# Patient Record
Sex: Male | Born: 1985 | Hispanic: Yes | Smoking: Never smoker
Health system: Southern US, Community
[De-identification: ages and names within clinical notes are randomized; demographics above are authoritative.]

## PROBLEM LIST (undated history)

## (undated) DIAGNOSIS — E7800 Pure hypercholesterolemia, unspecified: Secondary | ICD-10-CM

## (undated) HISTORY — PX: LEG SURGERY: SHX1003

---

## 2015-08-19 ENCOUNTER — Encounter (HOSPITAL_COMMUNITY): Payer: Self-pay | Admitting: Emergency Medicine

## 2015-08-19 DIAGNOSIS — L03031 Cellulitis of right toe: Secondary | ICD-10-CM | POA: Insufficient documentation

## 2015-08-19 NOTE — ED Notes (Signed)
Pt c/o pain to right great toe x's 1 week.  C/O pain and swelling around nail of toe

## 2015-08-20 ENCOUNTER — Emergency Department (HOSPITAL_COMMUNITY): Payer: Self-pay

## 2015-08-20 ENCOUNTER — Emergency Department (HOSPITAL_COMMUNITY)
Admission: EM | Admit: 2015-08-20 | Discharge: 2015-08-20 | Disposition: A | Discharge status: Home / Self Care | Payer: Self-pay | Attending: Emergency Medicine | Admitting: Emergency Medicine

## 2015-08-20 DIAGNOSIS — L03031 Cellulitis of right toe: Secondary | ICD-10-CM

## 2015-08-20 MED ORDER — LIDOCAINE HCL (PF) 1 % IJ SOLN
5.0000 mL | Freq: Once | INTRAMUSCULAR | Status: AC
  Administered 2015-08-20: 5 mL

## 2015-08-20 MED ORDER — HYDROCODONE-ACETAMINOPHEN 5-325 MG PO TABS: 1.0000 | ORAL_TABLET | Freq: Four times a day (QID) | ORAL | Status: DC | PRN

## 2015-08-20 MED ORDER — SULFAMETHOXAZOLE-TRIMETHOPRIM 800-160 MG PO TABS: 1.0000 | ORAL_TABLET | Freq: Two times a day (BID) | ORAL | Status: AC

## 2015-08-20 MED ORDER — TETANUS-DIPHTH-ACELL PERTUSSIS 5-2.5-18.5 LF-MCG/0.5 IM SUSP
0.5000 mL | Freq: Once | INTRAMUSCULAR | Status: AC
  Administered 2015-08-20: 0.5 mL via INTRAMUSCULAR
  Filled 2015-08-20: qty 0.5

## 2015-08-20 MED ORDER — AMOXICILLIN-POT CLAVULANATE 875-125 MG PO TABS: 1.0000 | ORAL_TABLET | Freq: Once | ORAL | Status: DC

## 2015-08-20 MED ORDER — SULFAMETHOXAZOLE-TRIMETHOPRIM 800-160 MG PO TABS
1.0000 | ORAL_TABLET | Freq: Once | ORAL | Status: AC
  Administered 2015-08-20: 1 via ORAL
  Filled 2015-08-20: qty 1

## 2015-08-20 MED ORDER — BUPIVACAINE HCL 0.25 % IJ SOLN
5.0000 mL | Freq: Once | INTRAMUSCULAR | Status: DC
  Filled 2015-08-20: qty 5

## 2015-08-20 MED ORDER — NAPROXEN 500 MG PO TABS
500.0000 mg | ORAL_TABLET | Freq: Two times a day (BID) | ORAL | Status: DC
Start: 2015-08-20 — End: 2016-04-29

## 2015-08-20 MED ORDER — BUPIVACAINE HCL (PF) 0.25 % IJ SOLN
5.0000 mL | Freq: Once | INTRAMUSCULAR | Status: AC
  Administered 2015-08-20: 5 mL

## 2015-08-20 NOTE — Discharge Instructions (Signed)
Do not take the narcotic if driving as it will make you sleepy. °

## 2015-08-20 NOTE — ED Notes (Signed)
Pt soaking toe in warm water.

## 2015-08-20 NOTE — ED Provider Notes (Signed)
CSN: [id number]     Arrival date & time 08/19/15  2326 History   First MD Initiated Contact with Patient 08/20/15 0038     Chief Complaint  Patient presents with  . Toe Pain     (Consider location/radiation/quality/duration/timing/severity/associated sxs/prior Treatment) The history is provided by the patient. No language interpreter was used.   Drew Levine is a 30 y.o. with pain, swelling and redness to the right great toe that started one week ago after he pulled an ingrown toenail. Patient has taken nothing for pain. Patient has been picking at the area and trying to pull out the ingrown nail.   History reviewed. No pertinent past medical history. History reviewed. No pertinent past surgical history. No family history on file. Social History  Substance Use Topics  . Smoking status: Never Smoker   . Smokeless tobacco: None  . Alcohol Use: No    Review of Systems Negative except as stated in HPI   Allergies  Review of patient's allergies indicates not on file.  Home Medications   Prior to Admission medications   Medication Sig Start Date End Date Taking? Authorizing Provider  HYDROcodone-acetaminophen (NORCO) 5-325 MG tablet Take 1 tablet by mouth every 6 (six) hours as needed. 08/20/15   Zianna Dercole Orlene OchM Caidence Kaseman, NP  naproxen (NAPROSYN) 500 MG tablet Take 1 tablet (500 mg total) by mouth 2 (two) times daily. 08/20/15   Atha Mcbain Orlene OchM Arlenne Kimbley, NP  sulfamethoxazole-trimethoprim (BACTRIM DS,SEPTRA DS) 800-160 MG tablet Take 1 tablet by mouth 2 (two) times daily. 08/20/15 08/27/15  Raad Clayson Orlene OchM Dameion Briles, NP   BP 129/85 mmHg  Pulse 82  Temp(Src) 97.3 F (36.3 C) (Oral)  Resp 16  Ht 5\' 3"  (1.6 m)  Wt 77.111 kg  BMI 30.12 kg/m2  SpO2 95% Physical Exam  Constitutional: He is oriented to person, place, and time. He appears well-developed and well-nourished. No distress.  HENT:  Head: Normocephalic.  Eyes: EOM are normal.  Neck: Neck supple.  Cardiovascular: Normal rate.   Pulmonary/Chest: Effort normal.    Musculoskeletal:       Right foot: There is tenderness and swelling. There is normal capillary refill.       Feet:  Swelling and erythema surrounding the nail of the right great toe. Small wound noted where patient pulled at a hangnail that is now infected.   Neurological: He is alert and oriented to person, place, and time. No cranial nerve deficit.  Skin: Skin is warm and dry.  Psychiatric: He has a normal mood and affect. His behavior is normal.  Nursing note and vitals reviewed.   ED Course  .Marland Kitchen.Incision and Drainage Date/Time: 08/20/2015 3:21 AM Performed by: Janne NapoleonNEESE, Jasreet Dickie M Authorized by: Janne NapoleonNEESE, Raizel Wesolowski M Consent: Verbal consent obtained. Risks and benefits: risks, benefits and alternatives were discussed Consent given by: patient Patient understanding: patient states understanding of the procedure being performed Imaging studies: imaging studies available Required items: required blood products, implants, devices, and special equipment available Patient identity confirmed: verbally with patient Indications for incision and drainage: paronychia. Body area: lower extremity Location details: right big toe Anesthesia: digital block Local anesthetic: lidocaine 1% without epinephrine and bupivacaine 0.25% without epinephrine Anesthetic total: 5 ml Patient sedated: no Needle gauge: 22 Complexity: complex Drainage: purulent Drainage amount: moderate Wound treatment: wound left open Patient tolerance: Patient tolerated the procedure well with no immediate complications Comments: Using hemostat to hold the nail and scissors I cut 1/8 inch of the nail that was ingrown and removed it.    (  including critical care time) X-rays, digital block I&D  Labs Review Labs Reviewed - No data to display Dg Toe Great Right  08/20/2015  CLINICAL DATA:  Great toe nail bed infection for 1 week. EXAM: RIGHT GREAT TOE COMPARISON:  None. FINDINGS: No bony destructive change, periosteal reaction, or  abnormal density to suggest osteomyelitis. The alignment and joint spaces are maintained. Questionable air about the nailbed. No tracking soft tissue air. No radiopaque foreign body. IMPRESSION: No radiographic findings of osteomyelitis of the great toe. Electronically Signed   By: Rubye OaksMelanie  Ehinger M.D.   On: 08/20/2015 01:44     MDM  30 y.o. male with infected ingrown toenail of the right great toe stable for d/c without red streaking, fever and does not appear toxic. Will treat with antibiotics and patient will soak in warm water and antibacterial soap. He will return for worsening symptoms.   Final diagnoses:  Paronychia of toe of right foot       Janne NapoleonHope M Ranya Fiddler, NP 08/20/15 0327  Marily MemosJason Mesner, MD 08/21/15 1730

## 2016-04-29 ENCOUNTER — Ambulatory Visit (INDEPENDENT_AMBULATORY_CARE_PROVIDER_SITE_OTHER): Payer: Self-pay | Admitting: Family Medicine

## 2016-04-29 VITALS — BP 122/72 | HR 65 | Temp 97.3°F | Resp 17 | Ht 64.5 in | Wt 171.0 lb

## 2016-04-29 DIAGNOSIS — L600 Ingrowing nail: Secondary | ICD-10-CM

## 2016-04-29 MED ORDER — DOXYCYCLINE HYCLATE 100 MG PO TABS: 100.0000 mg | ORAL_TABLET | Freq: Two times a day (BID) | ORAL | 0 refills | Status: AC

## 2016-04-29 NOTE — Progress Notes (Signed)
   Drew GuadalajaraSaul Levine is a 31 y.o. male who presents to [facility] Care at [facility] today for:  1. Ingorwn toenail. Patient presents with ingrown toenail on the right great toe. States that he has had ingrown toenails here before. Went to the ED over a year ago to have toenail removed. States that they removed part of his toenail but since it has grown back and start causing pain. Pain is starting to affect his gait. Has been having issues with ingrown toenails for years. States that when it starts getting painful he soaks his toe and salt and sometimes puts lemon on it. If it is continue to drain ever since last year when it was removed. Intense has associated pus. Denies any excessive erythema around toe. Denies any fevers.   ROS as above.  Pertinently, no chest pain, palpitations, SOB, Fever, Chills, Abd pain, N/V/D.   PMH reviewed. Patient is a nonsmoker.   No past medical history on file. No past surgical history on file.  Medications reviewed. No current outpatient prescriptions on file.   No current facility-administered medications for this visit.     Physical Exam:  BP 122/72   Pulse 65   Temp 97.3 F (36.3 C) (Oral)   Resp 17   Ht 5' 4.5" (1.638 m)   Wt 171 lb (77.6 kg)   SpO2 97%   BMI 28.90 kg/m  Gen:  Alert, cooperative patient who appears stated age in no acute distress.  Vital signs reviewed. HEENT: EOMI,  MMM Pulm: effort normal Cardiac:  Regular rate  Exts: Non edematous BL LE, warm and well perfused. Right greater toenail with swelling and medial nailbed with skin breakdown and drainage. No surrounding erythema or warmth. Elevated area of hardened skin appreciated on medial aspect of toe.  Assessment and Plan:  1. Ingrown right greater toenail May be showing beginning stages of infection. Rx for doxycycline given. Complete toenail removed (see procedure note below). Aftercare instructions given.  Procedure note Ingrown nail, possible mild infection.  Patient gave  verbal consent. Appropriate time out taken. Used tourniquet at base of the toe.  Digital block done using 2% lidocaine without epinephrine, 8cc total.  Area prepped and draped in usual sterile fashion. Complete nail elevated using nail elevator, removed using hemostats and traction force. Patient had extra skin that was blocking regular nail growth. Excess skin was taken off with 11 blade. Nail bed was cleaned with copious saline wash. Xeroform was then placed over nailbed. No complications; patient was difficult to numb.  Minimal bleeding.  Sterile bandage applied and post procedure instructions given.  Patient tolerated procedure well without complications.   Drew AdaJazma Jalen Daluz, DO 04/29/2016, 3:56 PM PGY-3, [facility]

## 2016-04-29 NOTE — Patient Instructions (Addendum)
   UA DEL DEDO E[place]ARNA[place] (Ingrown Toenail) . Mantenga el rea limpia, seca y vendada por 24 horas. Marland Kitchen. Despus de 24 horas, quite el vendaje externo y deje el [place] [employer] officergaza en lugar. . Empape el dedo del pie/el pie en el agua jabonosa tibia por 5-10 minutos, una vez diario por 5 das. Reaplique un vendaje nuevo despus de cada limpieza. . Contine empapa hasta que se cae la gaza amarilla. . Notifique la oficina si usted tiene el siguiente de los muestras de la infeccin: Hinchazn, enrojecimiento, calor, drenaje del pus, de la fiebre > 101.0  INGROWN TOENAIL . Keep area clean, dry and bandaged for 24 hours. . After 24 hours, remove outer bandage and leave yellow gauze in place. [place]. Soak toe/foot in warm soapy water for 5-10 minutes, once daily for 5 days. Rebandage toe after each cleaning. . Continue soaks until yellow gauze falls off. . Notify the office if you experience any of the following signs of infection: Swelling, redness, pus drainage, streaking, fever > 101.0 F    IF you received an x-ray today, you will receive an invoice from [facility]. Please contact [facility] at [phone] with questions or concerns regarding your invoice.   IF you received labwork today, you will receive an invoice from [facility]. Please contact [facility] at [phone] with questions or concerns regarding your invoice.   Our billing staff will not be able to assist you with questions regarding bills from these companies.  You will be contacted with the lab results as soon as they are available. The fastest way to get your results is to activate your My Chart account. Instructions are located on the last page of this paperwork. If you have not heard from us regarding the results in 2 weeks, please contact this office.

## 2017-08-25 ENCOUNTER — Ambulatory Visit (INDEPENDENT_AMBULATORY_CARE_PROVIDER_SITE_OTHER): Payer: Self-pay | Admitting: Physician Assistant

## 2017-09-08 ENCOUNTER — Ambulatory Visit (INDEPENDENT_AMBULATORY_CARE_PROVIDER_SITE_OTHER): Payer: Self-pay | Admitting: Physician Assistant

## 2017-09-28 ENCOUNTER — Emergency Department (HOSPITAL_COMMUNITY)
Admission: EM | Admit: 2017-09-28 | Discharge: 2017-09-29 | Disposition: A | Discharge status: Home / Self Care | Payer: Self-pay | Attending: Emergency Medicine | Admitting: Emergency Medicine

## 2017-09-28 ENCOUNTER — Encounter (HOSPITAL_COMMUNITY): Payer: Self-pay | Admitting: Emergency Medicine

## 2017-09-28 ENCOUNTER — Other Ambulatory Visit: Payer: Self-pay

## 2017-09-28 DIAGNOSIS — L600 Ingrowing nail: Secondary | ICD-10-CM

## 2017-09-28 NOTE — ED Triage Notes (Signed)
Pt c/o ingrown toenail on right great toe x's 1 week.  Pt c/o pain in toe and pain radiates up right leg

## 2017-09-29 MED ORDER — CEPHALEXIN 500 MG PO CAPS: 500.0000 mg | ORAL_CAPSULE | Freq: Four times a day (QID) | ORAL | 0 refills | Status: AC

## 2017-09-29 NOTE — ED Provider Notes (Signed)
[facility] Provider Note   CSN: [id number] Arrival date & time: 09/28/17  2215     History   Chief Complaint Chief Complaint  Patient presents with  . Foot Pain    HPI [employer] translation services used for this visit. Drew Levine is a 32 y.o. male feeling for 1 week of right great toe pain.  Patient states that he has similar pain every time that his right toenail becomes ingrown and he has to go to his doctor to have it removed.  Patient states that he has developed worsening pain in his right great toe over the past 3 days that now radiates up his right foot.  Patient also states that he has had some pus draining from the corner of his right toenail.  Patient denies weakness, numbness or tingling or color change to the toe.  Patient denies injury to the area.  Patient denies history of chronic medical conditions including diabetes. HPI  History reviewed. No pertinent past medical history.  There are no active problems to display for this patient.   Past Surgical History:  Procedure Laterality Date  . LEG SURGERY          Home Medications    Prior to Admission medications   Medication Sig Start Date End Date Taking? Authorizing Provider  cephALEXin (KEFLEX) 500 MG capsule Take 1 capsule (500 mg total) by mouth 4 (four) times daily for 10 days. 09/29/17 10/09/17  Bill SalinasMorelli, Brandon A, PA-C    Family History No family history on file.  Social History Social History   Tobacco Use  . Smoking status: Never Smoker  . Smokeless tobacco: Never Used  Substance Use Topics  . Alcohol use: No  . Drug use: No     Allergies   Patient has no allergy information on record.   Review of Systems Review of Systems  Constitutional: Negative.  Negative for chills, fatigue and fever.  Musculoskeletal: Negative for arthralgias and joint swelling.  Skin: Positive for wound. Negative for color change and rash.     Physical  Exam Updated Vital Signs BP 127/87   Pulse 74   Temp 98.3 F (36.8 C) (Oral)   Resp 18   Ht 5\' 5"  (1.651 m)   SpO2 97%   BMI 28.46 kg/m   Physical Exam  Constitutional: He is oriented to person, place, and time. He appears well-developed and well-nourished. No distress.  HENT:  Head: Normocephalic and atraumatic.  Right Ear: External ear normal.  Left Ear: External ear normal.  Nose: Nose normal.  Eyes: Pupils are equal, round, and reactive to light. EOM are normal.  Neck: Trachea normal and normal range of motion. No tracheal deviation present.  Cardiovascular:  Pulses:      Dorsalis pedis pulses are 3+ on the right side, and 3+ on the left side.       Posterior tibial pulses are 3+ on the right side, and 3+ on the left side.  Pulmonary/Chest: Effort normal. No respiratory distress.  Musculoskeletal: Normal range of motion.       Right knee: Normal.       Left knee: Normal.       Right ankle: Normal.       Left ankle: Normal.       Right lower leg: Normal. He exhibits no tenderness, no swelling, no edema and no deformity.       Left lower leg: Normal.       Right  foot: There is tenderness and swelling. There is normal range of motion, normal capillary refill and no deformity.       Left foot: Normal.  Feet:  Right Foot:  Protective Sensation: 3 sites tested. 3 sites sensed.  Left Foot:  Protective Sensation: 3 sites tested. 3 sites sensed.  Neurological: He is alert and oriented to person, place, and time. No sensory deficit.  Skin: Skin is warm and dry.  Psychiatric: He has a normal mood and affect. His behavior is normal.       ED Treatments / Results  Labs (all labs ordered are listed, but only abnormal results are displayed) Labs Reviewed - No data to display  EKG None  Radiology No results found.  Procedures Procedures (including critical care time)  Medications Ordered in ED Medications - No data to display   Initial Impression / Assessment and  Plan / ED Course  I have reviewed the triage vital signs and the nursing notes.  Pertinent labs & imaging results that were available during my care of the patient were reviewed by me and considered in my medical decision making (see chart for details).    Patient presenting with 1 week history of right great toe ingrown toenail.  Patient with 3 days of increased pain and swelling.  Patient with history of ingrown toenails.  Drainage present to the corner of the right toe.  There is no surrounding erythema or streaking.  Patient is neurovascularly intact to the toe, capillary refill and sensation intact.  Pedal pulses strong and equal bilaterally.  Patient has full range of motion of the toe.  Patient is ambulatory with some increased pain to the area.  I have given the patient a prescription for Keflex as well as a referral to podiatrist.  Translation services were used during this visit and patient states understanding of importance of taking antibiotic medication and following up with the podiatrist as soon as possible for further evaluation and treatment.  I have given the patient and out for foot care.  Patient is afebrile, not tachycardic, not hypotensive, no acute distress at this time.  At this time there does not appear to be any evidence of an acute emergency medical condition and the patient appears stable for discharge with appropriate outpatient follow up. Diagnosis was discussed with patient who verbalizes understanding of care plan and is agreeable to discharge. I have discussed return precautions with patient who verbalizes understanding of return precautions. Patient strongly encouraged to follow-up with their PCP. All questions answered.    Note: Portions of this report may have been transcribed using voice recognition software. Every effort was made to ensure accuracy; however, inadvertent computerized transcription errors may still be present.   Final Clinical Impressions(s) /  ED Diagnoses   Final diagnoses:  Ingrown nail of great toe of right foot    ED Discharge Orders         Ordered    cephALEXin (KEFLEX) 500 MG capsule  4 times daily     09/29/17 0035           Bill SalinasMorelli, Brandon A, PA-C 09/29/17 0054    Nira Connardama, Pedro Eduardo, MD 09/29/17 [id number]

## 2017-09-29 NOTE — Discharge Instructions (Signed)
Please call the podiatrist, Dr. Logan BoresEvans, to schedule an appointment for further evaluation and treatment of your toe pain. This is the foot doctor. Please take the antibiotic, Keflex, as prescribed for infection. Please return to the emergency department for any new or worsening symptoms. Please also follow-up with your primary care provider regarding your visit today.  SOLICITE ATENCIN MDICA SI: Los sntomas no mejoran con Scientist, research (medical)el tratamiento. SOLICITE ATENCIN MDICA DE INMEDIATO SI: Tiene lneas rojas que comienzan en el pie y continan en la pierna. Tiene fiebre. El enrojecimiento, la hinchazn o el dolor Van Horneaumentan. Observa lquido, sangre o pus que sale de la ua del pie.  RESOURCE GUIDE  Chronic Pain Problems: Contact [facility]  [phone] Patients need to be referred by their primary care doctor.  Insufficient Money for Medicine: Contact United Way:  call "211" or [facility] [phone].  No Primary Care Doctor: Call Health Connect  [phone] - can help you locate a primary care doctor that  accepts your insurance, provides certain services, etc. Physician Referral Service- [phone]  Agencies that provide inexpensive medical care: [facility]  [phone] [facility]  [phone] [facility]  [phone] [facility]  [phone] [facility]  [phone] [facility]  [phone]  Medicaid-accepting [facility] Providers: [facility]- [address], [address]  [phone], Mon-Fri 9am-7pm, Sat 9am-1pm [facility]- [address], [address] [place]  [phone] [facility]- [address], [address] [place]  [phone] [facility]- [address]  [phone] Renaye RakersVeita Bland- [address], [address], [phone]  Only accepts [place] Access [place] patients after they have their name  applied to their card  Self Pay  (no insurance) in [facility]: Sickle Cell Patients: Dr Willey BladeEric Dean, [facility]  [address], [phone] [facility]- [address]  [phone]       Redge Gainer-     [facility]- [address], [address]       -     [facility]- see information above (Speak to CitigroupPam H if you do not have insurance)       -  [facility]- [address], [phone]       -  [facility]- [address],  [phone]       -  [facility]- [address], [phone]       -  Dr Julio Sickssei-Bonsu-  [address], [address], [place], [phone]       -  [facility]- [address], [phone]       -  [facility] [place]- [address], [phone], also [address], [phone]       -    [facility]- [address], [phone], 1st & 3rd Saturday   every month, 10am-1pm  1) Find a Doctor and Pay Out of Pocket Although you won't have to find out who is covered by your insurance plan, it is a good idea to ask around and get recommendations. You will then need to call the office and see if the doctor you have chosen will accept you as a new patient and what types of options they offer for patients who are self-pay. Some doctors offer discounts or will set up payment  plans for their patients who do not have insurance, but you will need to ask so you aren't surprised when you get to your appointment.  2) Contact Your Local Health Department Not all health departments have doctors that can see patients for sick visits, but many do, so it is worth a call to see if yours does. If you don't know where your local health department is, you can check in your phone book. The CDC also has a tool to help you locate your state's health department, and many state websites also have listings of all of their local health departments.  3) Find a Walk-in Clinic If your illness is not likely to be very severe  or complicated, you may want to try a walk in clinic. These are popping up all over the country in pharmacies, drugstores, and shopping centers. They're usually staffed by nurse practitioners or physician assistants that have been trained to treat common illnesses and complaints. They're usually fairly quick and inexpensive. However, if you have serious medical issues or chronic medical problems, these are probably not your best option  STD Testing [facility], STD Clinic, [address] [place], phone [phone] or [phone].  Monday - Friday, call for an appointment. [facility] [employer], STD Clinic, [address], [place], phone [phone] or [phone].  Monday - Friday, call for an appointment.  Abuse/Neglect: [facility] Child Abuse Hotline (213)210-0537 [facility] Child Abuse Hotline 9142376008 (After Hours)  Emergency Shelter:  [facility] 762-086-8912  Maternity Homes: Room at the [place] [facility] [phone]) [phone] [facility] [phone]  MRSA Hotline #:   586-790-7449  [facility] Resources  [facility] [place]  United Way [facility] Dept. [address]                 [address]         [address] [place] [address]  Blondell Reveal Phone:  [phone]                                  Phone:  [phone]                   Phone:  [phone]  [facility], [phone] [facility]- [phone]       -     [facility] in [place], [address],                                  [phone], [facility] Child Abuse Hotline 980-268-0820 or 365-391-3897 (After Hours)   Behavioral Health Services  Substance Abuse  Resources: Alcohol and Drug Services  2245169893 [facility] [phone] [facility] [place] [phone] [address] [phone] Residential & Outpatient  Substance Abuse Program  [phone]  Psychological Services: [facility]  [phone] [facility]  [phone] [facility], [phone] [address], [place], ACCESS LINE: [phone] or [phone], [email or web address]/adult.htm  Dental Assistance  If unable to pay or uninsured, contact:  [facility] or [facility] to become qualified for the adult dental clinic.  Patients with Medicaid: [facility] [phone] [address], [phone] [address], [phone]  If unable to pay, or uninsured, contact [facility] [phone]) or [facility] [phone] in [place], [phone] in [facility]) to become qualified for the adult dental clinic   Other Low-Cost Community Dental Services: [facility]- [address], [place], [place], [phone], [phone], [phone], 2nd and 4th Thursday of the month at 6:30am.  10 clients each day by appointment, can sometimes see walk-in patients if someone does not show for an appointment. [facility]- [address], [place], [place], [phone], [phone] [facility]- [address], [place], [place], [address], [phone] [facility]- [phone] [address] [facility]- [phone] [phone]- [phone]

## 2017-09-29 NOTE — ED Notes (Signed)
See provider assessment 

## 2017-10-06 ENCOUNTER — Ambulatory Visit: Payer: Self-pay | Admitting: Physician Assistant

## 2017-10-06 ENCOUNTER — Other Ambulatory Visit: Payer: Self-pay

## 2017-10-06 ENCOUNTER — Encounter: Payer: Self-pay | Admitting: Physician Assistant

## 2017-10-06 VITALS — BP 120/78 | HR 82 | Temp 98.2°F | Resp 18 | Ht 65.0 in | Wt 168.8 lb

## 2017-10-06 DIAGNOSIS — L600 Ingrowing nail: Secondary | ICD-10-CM

## 2017-10-06 NOTE — Progress Notes (Signed)
   Merdis DelaySaul Herrera Morales  MRN: [id number] DOB: [birth year]  PCP: Patient, No Pcp Per  Subjective:  Pt is a 32 year old male who presents to clinic for ingrown toenail of his right big toe.  Patient speaks Spanish and is here today with a friend who is interpreting for him.  He was seen in the emergency department on 8/13 for this problem.  Toenail removal was not performed but he was given prescription of Keflex which she was supposed to take 4 times a day.  He admits to medication noncompliance due to dosing schedule Pain has not improved and he would like his nail removed today  Review of Systems  Musculoskeletal: Positive for arthralgias and gait problem.  Skin: Positive for wound.    There are no active problems to display for this patient.   Current Outpatient Medications on File Prior to Visit  Medication Sig Dispense Refill  . cephALEXin (KEFLEX) 500 MG capsule Take 1 capsule (500 mg total) by mouth 4 (four) times daily for 10 days. 40 capsule 0   No current facility-administered medications on file prior to visit.     No Known Allergies   Objective:  BP 120/78   Pulse 82   Temp 98.2 F (36.8 C) (Oral)   Resp 18   Ht 5\' 5"  (1.651 m)   Wt 168 lb 12.8 oz (76.6 kg)   SpO2 96%   BMI 28.09 kg/m   Physical Exam  Constitutional: He is oriented to person, place, and time. He appears well-developed and well-nourished.  Musculoskeletal:       Feet:  Neurological: He is alert and oriented to person, place, and time.  Skin: Skin is warm and dry.  Psychiatric: He has a normal mood and affect. His behavior is normal. Judgment and thought content normal.  Vitals reviewed.  Procedure: Verbal consent obtained. Skin was cleaned with Betadine. Digital block performed with 2% plain lidocaine on proximal right big toe.  Toenail was completely avulsed from nailbed. Xeroform placed on nailbed. Dressing placed and wound care discussed.  Assessment and Plan :  1. Ingrown toenail of  right foot - Patient presents for worsening ingrown toenail of his right big toe.  Excision of nail performed successfully.  Wound was dressed and wound care discussed with instructions printed out in Spanish for patient.  Return to clinic as needed.   Marco CollieWhitney Samary Shatz, PA-C  Primary Care at [facility] : [facility] 10/06/2017 5:08 PM  Please note: Portions of this report may have been transcribed using dragon voice recognition software. Every effort was made to ensure accuracy; however, inadvertent computerized transcription errors may be present.

## 2017-10-06 NOTE — Patient Instructions (Addendum)
  UA DEL DEDO ENCARNADA (Ingrown Toenail) . Mantenga el rea limpia, seca y vendada por 24 horas. Marland Kitchen Despus de 24 horas, quite el vendaje externo y deje el amarillo Control and instrumentation engineer. . Empape el dedo del pie/el pie en el agua jabonosa tibia por 5-10 minutos, una vez diario por 5 das. Reaplique un vendaje nuevo despus de cada limpieza. . Contine empapa hasta que se cae la gaza amarilla. . Notifique la oficina si usted tiene el siguiente de los muestras de la infeccin: Hinchazn, enrojecimiento, calor, drenaje del pus, de la fiebre > 101.0   Thank you for coming in today. I hope you feel we met your needs.  Feel free to call PCP if you have any questions or further requests.  Please consider signing up for MyChart if you do not already have it, as this is a great way to communicate with me.  Best,  Whitney McVey, PA-C     IF you received an x-ray today, you will receive an invoice from [facility]. Please contact [facility] at [phone] with questions or concerns regarding your invoice.   IF you received labwork today, you will receive an invoice from [facility]. Please contact [facility] at [phone] with questions or concerns regarding your invoice.   Our billing staff will not be able to assist you with questions regarding bills from these companies.  You will be contacted with the lab results as soon as they are available. The fastest way to get your results is to activate your My Chart account. Instructions are located on the last page of this paperwork. If you have not heard from [place] regarding the results in 2 weeks, please contact this office.

## 2017-10-13 ENCOUNTER — Ambulatory Visit (INDEPENDENT_AMBULATORY_CARE_PROVIDER_SITE_OTHER): Payer: Self-pay | Admitting: Physician Assistant

## 2017-11-22 ENCOUNTER — Emergency Department (HOSPITAL_COMMUNITY)
Admission: EM | Admit: 2017-11-22 | Discharge: 2017-11-22 | Disposition: A | Discharge status: Home / Self Care | Payer: Self-pay | Attending: Emergency Medicine | Admitting: Emergency Medicine

## 2017-11-22 ENCOUNTER — Encounter (HOSPITAL_COMMUNITY): Payer: Self-pay | Admitting: *Deleted

## 2017-11-22 ENCOUNTER — Other Ambulatory Visit: Payer: Self-pay

## 2017-11-22 ENCOUNTER — Emergency Department (HOSPITAL_COMMUNITY): Payer: Self-pay

## 2017-11-22 DIAGNOSIS — Y999 Unspecified external cause status: Secondary | ICD-10-CM | POA: Insufficient documentation

## 2017-11-22 DIAGNOSIS — Y929 Unspecified place or not applicable: Secondary | ICD-10-CM | POA: Insufficient documentation

## 2017-11-22 DIAGNOSIS — Y939 Activity, unspecified: Secondary | ICD-10-CM | POA: Insufficient documentation

## 2017-11-22 DIAGNOSIS — S62346A Nondisplaced fracture of base of fifth metacarpal bone, right hand, initial encounter for closed fracture: Secondary | ICD-10-CM | POA: Insufficient documentation

## 2017-11-22 DIAGNOSIS — W228XXA Striking against or struck by other objects, initial encounter: Secondary | ICD-10-CM | POA: Insufficient documentation

## 2017-11-22 NOTE — ED Notes (Signed)
Patient transported to X-ray 

## 2017-11-22 NOTE — ED Triage Notes (Signed)
Pt in with injury to his right hand after hitting something, swelling noted

## 2017-11-22 NOTE — ED Notes (Signed)
Ortho tech paged  

## 2017-11-22 NOTE — Discharge Instructions (Signed)
Please keep the splint on and dry.    Return if any problems.  Dr. Carollee Massed office will call you tomorrow to arrange your next appointment with him in roughly a week to be casted.

## 2017-11-22 NOTE — ED Provider Notes (Signed)
[facility] EMERGENCY DEPARTMENT Provider Note   CSN: [id number] Arrival date & time: 11/22/17  1423     History   Chief Complaint Chief Complaint  Patient presents with  . Hand Injury    HPI Drew Levine is a 32 y.o. male.  He is right-hand dominant.  He is complaining of right hand pain for 3 days after being struck with wood to the hand.  It is painful with movement and improved with rest.  The pain does radiate up the forearm a little bit.  No numbness.  He is tried nothing for it.  He works in [employer] and was unable to work.  No other injuries or complaints.  The history is provided by the patient.  Hand Injury   Incident onset: 3 days. The incident occurred in the yard. The injury mechanism was a direct blow. The pain is present in the right hand. The quality of the pain is described as throbbing. The pain is moderate. The pain has been constant since the incident. Pertinent negatives include no fever. The symptoms are aggravated by movement, use and palpation.    History reviewed. No pertinent past medical history.  There are no active problems to display for this patient.   Past Surgical History:  Procedure Laterality Date  . LEG SURGERY          Home Medications    Prior to Admission medications   Not on File    Family History History reviewed. No pertinent family history.  Social History Social History   Tobacco Use  . Smoking status: Never Smoker  . Smokeless tobacco: Never Used  Substance Use Topics  . Alcohol use: No  . Drug use: No     Allergies   Patient has no known allergies.   Review of Systems Review of Systems  Constitutional: Negative for fever.  Respiratory: Negative for shortness of breath.   Cardiovascular: Negative for chest pain.  Skin: Negative for rash.     Physical Exam Updated Vital Signs BP (!) 108/53 (BP Location: Left Arm)   Pulse 61   Temp 98.3 F (36.8 C) (Oral)   Resp 16    SpO2 99%   Physical Exam  Constitutional: He appears well-developed and well-nourished.  HENT:  Head: Normocephalic and atraumatic.  Eyes: Conjunctivae are normal.  Neck: Neck supple.  Pulmonary/Chest: Effort normal.  Musculoskeletal:  His right hand is markedly swollen over the dorsum with diffuse tenderness mostly over the fourth and fifth metacarpal.  He has full range of motion of his digits but it is slightly limited with the swelling.  Cap refill and sensation intact.  No particular wrist pain or limitations.  Elbow and shoulder nontender.  Neurological: He is alert. GCS eye subscore is 4. GCS verbal subscore is 5. GCS motor subscore is 6.  Skin: Skin is warm and dry.  Psychiatric: He has a normal mood and affect.  Nursing note and vitals reviewed.    ED Treatments / Results  Labs (all labs ordered are listed, but only abnormal results are displayed) Labs Reviewed - No data to display  EKG None  Radiology Dg Hand Complete Right  Result Date: 11/22/2017 CLINICAL DATA:  Hit by tree 3 days ago with pain and swelling EXAM: RIGHT HAND - COMPLETE 3+ VIEW COMPARISON:  None. FINDINGS: There is a minimally displaced fracture through the base of the right fifth metacarpal present with adjacent soft tissue swelling. The radiocarpal joint space is normal.  The carpal bones are in normal position. No other fracture is seen. MCP, PIP, DIP joints appear normal. IMPRESSION: Minimally displaced fracture through the base of the right fifth metacarpal. Electronically Signed   By: Dwyane Dee M.D.   On: 11/22/2017 15:45    Procedures Procedures (including critical care time)  Medications Ordered in ED Medications - No data to display   Initial Impression / Assessment and Plan / ED Course  I have reviewed the triage vital signs and the nursing notes.  Pertinent labs & imaging results that were available during my care of the patient were reviewed by me and considered in my medical decision  making (see chart for details).  Clinical Course as of Nov 24 1439  Mon Nov 22, 2017  1553 By x-ray patient has a fracture of the base of his fifth metacarpal.  I reviewed this with Mr. Lin Givens from orthopedics who recommends ulnar gutter and will have follow-up with Dr. Janee Morn as an outpatient.   [MB]    Clinical Course User Index [MB] Terrilee Files, MD      Final Clinical Impressions(s) / ED Diagnoses   Final diagnoses:  Closed nondisplaced fracture of base of fifth metacarpal bone of right hand, initial encounter    ED Discharge Orders    None       Terrilee Files, MD 11/23/17 1441

## 2017-11-22 NOTE — Consult Note (Addendum)
ORTHOPAEDIC CONSULTATION HISTORY & PHYSICAL REQUESTING PHYSICIAN: Terrilee Files, MD  Chief Complaint: Right hand injury  HPI: Drew Levine is a 32 y.o. male, RHD, employed in food preparation who presents for evaluation of her right hand injury that occurred when he struck in a movable object 3 days ago.  He has had bruising pain and swelling, centered on the dorsal and ulnar aspect of the hand, prompting evaluation.  History reviewed. No pertinent past medical history. Past Surgical History:  Procedure Laterality Date  . LEG SURGERY     Social History   Socioeconomic History  . Marital status: Significant Other    Spouse name: Not on file  . Number of children: Not on file  . Years of education: Not on file  . Highest education level: Not on file  Occupational History  . Not on file  Social Needs  . Financial resource strain: Not on file  . Food insecurity:    Worry: Not on file    Inability: Not on file  . Transportation needs:    Medical: Not on file    Non-medical: Not on file  Tobacco Use  . Smoking status: Never Smoker  . Smokeless tobacco: Never Used  Substance and Sexual Activity  . Alcohol use: No  . Drug use: No  . Sexual activity: Never  Lifestyle  . Physical activity:    Days per week: Not on file    Minutes per session: Not on file  . Stress: Not on file  Relationships  . Social connections:    Talks on phone: Not on file    Gets together: Not on file    Attends religious service: Not on file    Active member of club or organization: Not on file    Attends meetings of clubs or organizations: Not on file    Relationship status: Not on file  Other Topics Concern  . Not on file  Social History Narrative  . Not on file   History reviewed. No pertinent family history. No Known Allergies Prior to Admission medications   Not on File   Dg Hand Complete Right  Result Date: 11/22/2017 CLINICAL DATA:  Hit by tree 3 days ago with pain and  swelling EXAM: RIGHT HAND - COMPLETE 3+ VIEW COMPARISON:  None. FINDINGS: There is a minimally displaced fracture through the base of the right fifth metacarpal present with adjacent soft tissue swelling. The radiocarpal joint space is normal. The carpal bones are in normal position. No other fracture is seen. MCP, PIP, DIP joints appear normal. IMPRESSION: Minimally displaced fracture through the base of the right fifth metacarpal. Electronically Signed   By: Dwyane Dee M.D.   On: 11/22/2017 15:45    Positive ROS: All other systems have been reviewed and were otherwise negative with the exception of those mentioned in the HPI and as above.  Physical Exam: Vitals: Refer to EMR. Constitutional:  WD, WN, NAD HEENT:  NCAT, EOMI Neuro/Psych:  Alert & oriented to person, place, and time; appropriate mood & affect Lymphatic: No generalized extremity edema or lymphadenopathy Extremities / MSK:  The extremities are normal with respect to appearance, ranges of motion, joint stability, muscle strength/tone, sensation, & perfusion except as otherwise noted:  The right hand is bruised and tender, particularly along the proximal portions of the fifth metacarpal.  No significant angulation palpable, no digital malrotation, digital motion only minimally restricted, MVI  Assessment: Relatively nondisplaced right fifth metacarpal base fracture, that appears to  be without significant articular incongruity  Plan: I discussed these findings with him.  We will plan to proceed with closed management.  Ulnar gutter splint to be applied today, likely transitioning to a short arm cast when he follows up in the office in a week. He reported his number to be [phone].  Questions invited and answered.  Cliffton Asters Janee Morn, MD      Orthopaedic & Hand Surgery [facility] Orthopaedic & Sports Medicine [facility] [address] [place], [place]  [address] Office: [phone] Mobile: [phone]  11/22/2017, 4:54 PM

## 2017-11-22 NOTE — ED Notes (Signed)
Dr. Thompson at bedside. 

## 2017-11-22 NOTE — Progress Notes (Signed)
Orthopedic Tech Progress Note Patient Details:  Drew Levine May 01, 1985 [id number]  Ortho Devices Type of Ortho Device: Ace wrap, Ulna gutter splint Ortho Device/Splint Location: rue Ortho Device/Splint Interventions: Application   Post Interventions Patient Tolerated: Well Instructions Provided: Care of device   Nikki Dom 11/22/2017, 5:10 PM

## 2017-12-08 ENCOUNTER — Ambulatory Visit (INDEPENDENT_AMBULATORY_CARE_PROVIDER_SITE_OTHER): Payer: Self-pay | Admitting: Physician Assistant

## 2017-12-08 ENCOUNTER — Encounter (INDEPENDENT_AMBULATORY_CARE_PROVIDER_SITE_OTHER): Payer: Self-pay | Admitting: Physician Assistant

## 2017-12-08 VITALS — BP 119/67 | HR 79 | Temp 98.5°F | Resp 18 | Ht 64.0 in | Wt 167.0 lb

## 2017-12-08 DIAGNOSIS — F524 Premature ejaculation: Secondary | ICD-10-CM

## 2017-12-08 DIAGNOSIS — Z23 Encounter for immunization: Secondary | ICD-10-CM

## 2017-12-08 DIAGNOSIS — R1013 Epigastric pain: Secondary | ICD-10-CM

## 2017-12-08 DIAGNOSIS — R12 Heartburn: Secondary | ICD-10-CM

## 2017-12-08 DIAGNOSIS — Z114 Encounter for screening for human immunodeficiency virus [HIV]: Secondary | ICD-10-CM

## 2017-12-08 MED ORDER — PAROXETINE HCL 10 MG PO TABS: 10.0000 mg | ORAL_TABLET | Freq: Every day | ORAL | 2 refills | Status: DC

## 2017-12-08 MED ORDER — OMEPRAZOLE 40 MG PO CPDR: 40.0000 mg | DELAYED_RELEASE_CAPSULE | Freq: Two times a day (BID) | ORAL | 0 refills | Status: DC

## 2017-12-08 NOTE — Patient Instructions (Signed)
Heartburn Heartburn is a type of pain or discomfort that can happen in the throat or chest. It is often described as a burning pain. It may also cause a bad taste in the mouth. Heartburn may feel worse when you lie down or bend over. It may be caused by stomach contents that move back up (reflux) into the tube that connects the mouth with the stomach (esophagus). Follow these instructions at home: Take these actions to lessen your discomfort and to help avoid problems. Diet  Follow a diet as told by your doctor. You may need to avoid foods and drinks such as: ? Coffee and tea (with or without caffeine). ? Drinks that contain alcohol. ? Energy drinks and sports drinks. ? Carbonated drinks or sodas. ? Chocolate and cocoa. ? Peppermint and mint flavorings. ? Garlic and onions. ? Horseradish. ? Spicy and acidic foods, such as peppers, chili powder, curry powder, vinegar, hot sauces, and BBQ sauce. ? Citrus fruit juices and citrus fruits, such as oranges, lemons, and limes. ? Tomato-based foods, such as red sauce, chili, salsa, and pizza with red sauce. ? Fried and fatty foods, such as donuts, french fries, potato chips, and high-fat dressings. ? High-fat meats, such as hot dogs, rib eye steak, sausage, ham, and bacon. ? High-fat dairy items, such as whole milk, butter, and cream cheese.  Eat small meals often. Avoid eating large meals.  Avoid drinking large amounts of liquid with your meals.  Avoid eating meals during the 2-3 hours before bedtime.  Avoid lying down right after you eat.  Do not exercise right after you eat. General instructions  Pay attention to any changes in your symptoms.  Take over-the-counter and prescription medicines only as told by your doctor. Do not take aspirin, ibuprofen, or other NSAIDs unless your doctor says it is okay.  Do not use any tobacco products, including cigarettes, chewing tobacco, and e-cigarettes. If you need help quitting, ask your  doctor.  Wear loose clothes. Do not wear anything tight around your waist.  Raise (elevate) the head of your bed about 6 inches (15 cm).  Try to lower your stress. If you need help doing this, ask your doctor.  If you are overweight, lose an amount of weight that is healthy for you. Ask your doctor about a safe weight loss goal.  Keep all follow-up visits as told by your doctor. This is important. Contact a doctor if:  You have new symptoms.  You lose weight and you do not know why it is happening.  You have trouble swallowing, or it hurts to swallow.  You have wheezing or a cough that keeps happening.  Your symptoms do not get better with treatment.  You have heartburn often for more than two weeks. Get help right away if:  You have pain in your arms, neck, jaw, teeth, or back.  You feel sweaty, dizzy, or light-headed.  You have chest pain or shortness of breath.  You throw up (vomit) and your throw up looks like blood or coffee grounds.  Your poop (stool) is bloody or black. This information is not intended to replace advice given to you by your health care provider. Make sure you discuss any questions you have with your health care provider. Document Released: 10/15/2010 Document Revised: 07/11/2015 Document Reviewed: 05/30/2014 Elsevier Interactive Patient Education  2018 Elsevier Inc.  

## 2017-12-08 NOTE — Progress Notes (Signed)
Subjective:  Patient ID: Drew Levine, male    DOB: 09/09/1985  Age: 32 y.o. MRN: [id number]  CC:   HPI Drew Levine is a 32 y.o. male with a medical history of closed nondisplaced fracture of base of fifth metacarpal bone of right hand presents with waxing and waning left sided chest pain since one year ago. Last episode of chest pain was three weeks ago. Felt chest pain with associated tremors and vomiting. Said he felt immediately better once he vomited. Has never been worked up by a physician. Says he suffers from "terrible" acid reflux. Chest pain worse when drinking coffee and Red Bull or when eating tomato sauce. No other associated symptoms.     Also complains of premature ejaculation. Would ejaculate after a "couple of minutes". Would like to know if any treatment is available as his premature ejaculation is causing strain in the relationship.      ROS Review of Systems  Constitutional: Negative for chills, fever and malaise/fatigue.  Eyes: Negative for blurred vision.  Respiratory: Negative for shortness of breath.   Cardiovascular: Negative for chest pain and palpitations.  Gastrointestinal: Positive for abdominal pain and vomiting. Negative for blood in stool, constipation, diarrhea, melena and nausea.  Genitourinary: Negative for dysuria and hematuria.  Musculoskeletal: Negative for joint pain and myalgias.  Skin: Negative for rash.  Neurological: Negative for tingling and headaches.  Psychiatric/Behavioral: Negative for depression. The patient is not nervous/anxious.     Objective:  BP 119/67 (BP Location: Left Arm, Patient Position: Sitting, Cuff Size: Normal)   Pulse 79   Temp 98.5 F (36.9 C) (Oral)   Resp 18   Ht 5\' 4"  (1.626 m)   Wt 167 lb (75.8 kg)   SpO2 95%   BMI 28.67 kg/m   BP/Weight 12/08/2017 11/22/2017 10/06/2017  Systolic BP 119 136 120  Diastolic BP 67 74 78  Wt. (Lbs) 167 - 168.8  BMI 28.67 - 28.09      Physical Exam   Constitutional: He is oriented to person, place, and time.  Well developed, well nourished, NAD, polite  HENT:  Head: Normocephalic and atraumatic.  Eyes: No scleral icterus.  Neck: Normal range of motion. Neck supple. No thyromegaly present.  Cardiovascular: Normal rate, regular rhythm and normal heart sounds.  Pulmonary/Chest: Effort normal and breath sounds normal.  Abdominal: Soft. Bowel sounds are normal. There is tenderness (mild epigastric TTP).  Musculoskeletal: He exhibits no edema.  Neurological: He is alert and oriented to person, place, and time.  Skin: Skin is warm and dry. No rash noted. No erythema. No pallor.  Psychiatric: He has a normal mood and affect. His behavior is normal. Thought content normal.  Vitals reviewed.    Assessment & Plan:   1. Heartburn - Begin omeprazole (PRILOSEC) 40 MG capsule; Take 1 capsule (40 mg total) by mouth 2 (two) times daily.  Dispense: 40 capsule; Refill: 0  2. Abdominal pain, epigastric - H. pylori antibody, IgG - Comprehensive metabolic panel - CBC with Differential - Lipase  3. Screening for HIV (human immunodeficiency virus) - HIV Antibody (routine testing w rflx)  4. Need for prophylactic vaccination and inoculation against influenza - Administered Fluarix in clinic today  5. Premature ejaculation - Begin PARoxetine (PAXIL) 10 MG tablet; Take 1 tablet (10 mg total) by mouth daily.  Dispense: 30 tablet; Refill: 2   Meds ordered this encounter  Medications  . omeprazole (PRILOSEC) 40 MG capsule    Sig: Take 1 capsule (  40 mg total) by mouth 2 (two) times daily.    Dispense:  40 capsule    Refill:  0    Order Specific Question:   Supervising Provider    Answer:   Hoy Register [[id number]]  . PARoxetine (PAXIL) 10 MG tablet    Sig: Take 1 tablet (10 mg total) by mouth daily.    Dispense:  30 tablet    Refill:  2    Order Specific Question:   Supervising Provider    Answer:   Hoy Register [[id number]]    Follow-up:  Return in about 4 weeks (around 01/05/2018) for Epigastric pain.   Loletta Specter PA

## 2017-12-10 ENCOUNTER — Other Ambulatory Visit (INDEPENDENT_AMBULATORY_CARE_PROVIDER_SITE_OTHER): Payer: Self-pay | Admitting: Physician Assistant

## 2017-12-10 DIAGNOSIS — A048 Other specified bacterial intestinal infections: Secondary | ICD-10-CM

## 2017-12-10 LAB — CBC WITH DIFFERENTIAL/PLATELET
Basophils Absolute: 0.1 10*3/uL (ref 0.0–0.2)
Basos: 1 %
EOS (ABSOLUTE): 0.3 10*3/uL (ref 0.0–0.4)
Eos: 4 %
Hematocrit: 47.9 % (ref 37.5–51.0)
Hemoglobin: 16 g/dL (ref 13.0–17.7)
Immature Grans (Abs): 0 10*3/uL (ref 0.0–0.1)
Immature Granulocytes: 0 %
Lymphocytes Absolute: 2.4 10*3/uL (ref 0.7–3.1)
Lymphs: 26 %
MCH: 29.2 pg (ref 26.6–33.0)
MCHC: 33.4 g/dL (ref 31.5–35.7)
MCV: 87 fL (ref 79–97)
Monocytes Absolute: 0.7 10*3/uL (ref 0.1–0.9)
Monocytes: 8 %
Neutrophils Absolute: 5.5 10*3/uL (ref 1.4–7.0)
Neutrophils: 61 %
Platelets: 342 10*3/uL (ref 150–450)
RBC: 5.48 x10E6/uL (ref 4.14–5.80)
RDW: 12.4 % (ref 12.3–15.4)
WBC: 9 10*3/uL (ref 3.4–10.8)

## 2017-12-10 LAB — COMPREHENSIVE METABOLIC PANEL
ALT: 23 IU/L (ref 0–44)
AST: 17 IU/L (ref 0–40)
Albumin/Globulin Ratio: 1.4 (ref 1.2–2.2)
Albumin: 4.3 g/dL (ref 3.5–5.5)
Alkaline Phosphatase: 79 IU/L (ref 39–117)
BUN/Creatinine Ratio: 22 — ABNORMAL HIGH (ref 9–20)
BUN: 13 mg/dL (ref 6–20)
Bilirubin Total: 0.3 mg/dL (ref 0.0–1.2)
CO2: 25 mmol/L (ref 20–29)
Calcium: 9.8 mg/dL (ref 8.7–10.2)
Chloride: 101 mmol/L (ref 96–106)
Creatinine, Ser: 0.59 mg/dL — ABNORMAL LOW (ref 0.76–1.27)
GFR calc Af Amer: 157 mL/min/{1.73_m2} (ref >59)
GFR calc non Af Amer: 136 mL/min/{1.73_m2} (ref >59)
Globulin, Total: 3 g/dL (ref 1.5–4.5)
Glucose: 88 mg/dL (ref 65–99)
Potassium: 4.2 mmol/L (ref 3.5–5.2)
Sodium: 142 mmol/L (ref 134–144)
Total Protein: 7.3 g/dL (ref 6.0–8.5)

## 2017-12-10 LAB — LIPASE: Lipase: 45 U/L (ref 13–78)

## 2017-12-10 LAB — HIV ANTIBODY (ROUTINE TESTING W REFLEX): HIV Screen 4th Generation wRfx: NONREACTIVE

## 2017-12-10 LAB — H. PYLORI ANTIBODY, IGG: H. pylori, IgG AbS: 4.86 Index Value — ABNORMAL HIGH (ref 0.00–0.79)

## 2017-12-10 MED ORDER — CLARITHROMYCIN 500 MG PO TABS: 500.0000 mg | ORAL_TABLET | Freq: Two times a day (BID) | ORAL | 0 refills | Status: AC

## 2017-12-10 MED ORDER — AMOXICILLIN 500 MG PO TABS: 1000.0000 mg | ORAL_TABLET | Freq: Two times a day (BID) | ORAL | 0 refills | Status: AC

## 2017-12-15 ENCOUNTER — Telehealth (INDEPENDENT_AMBULATORY_CARE_PROVIDER_SITE_OTHER): Payer: Self-pay

## 2017-12-15 NOTE — Telephone Encounter (Signed)
Called all numbers listed for patient using pacific interpreter [phone]) numbers either unavailable or states call can not be completed at this time. Will try once more before mailing results. If patient calls please inform that he has been exposed to stomach bacteria and antibiotics have been sent to [facility], take as directed. HIV negative and all other labs normal. Maryjean Morn, CMA

## 2017-12-15 NOTE — Telephone Encounter (Signed)
-----   Message from Loletta Specter, PA-C sent at 12/10/2017 12:53 PM EDT ----- Pt has been exposed stomach bacteria. I will send antibiotics now to [facility]. Take as directed. HIV negative and rest of labs normal.

## 2017-12-20 ENCOUNTER — Encounter (INDEPENDENT_AMBULATORY_CARE_PROVIDER_SITE_OTHER): Payer: Self-pay

## 2017-12-20 ENCOUNTER — Telehealth (INDEPENDENT_AMBULATORY_CARE_PROVIDER_SITE_OTHER): Payer: Self-pay

## 2017-12-20 NOTE — Telephone Encounter (Signed)
-----   Message from Loletta Specter, PA-C sent at 12/10/2017 12:53 PM EDT ----- Pt has been exposed stomach bacteria. I will send antibiotics now to [facility]. Take as directed. HIV negative and rest of labs normal.

## 2017-12-20 NOTE — Telephone Encounter (Signed)
Call placed using pacific interpreter [phone]) left message asking patient to return call to [facility] at [phone]. Results mailed to patient as this was the second unsuccessful attempt. Maryjean Morn, CMA

## 2018-01-05 ENCOUNTER — Encounter (INDEPENDENT_AMBULATORY_CARE_PROVIDER_SITE_OTHER): Payer: Self-pay | Admitting: Physician Assistant

## 2018-01-05 ENCOUNTER — Other Ambulatory Visit: Payer: Self-pay

## 2018-01-05 ENCOUNTER — Ambulatory Visit (INDEPENDENT_AMBULATORY_CARE_PROVIDER_SITE_OTHER): Payer: Self-pay | Admitting: Physician Assistant

## 2018-01-05 VITALS — BP 119/77 | HR 61 | Temp 97.7°F | Ht 64.0 in | Wt 171.6 lb

## 2018-01-05 DIAGNOSIS — F524 Premature ejaculation: Secondary | ICD-10-CM

## 2018-01-05 DIAGNOSIS — B9681 Helicobacter pylori [H. pylori] as the cause of diseases classified elsewhere: Secondary | ICD-10-CM

## 2018-01-05 DIAGNOSIS — R12 Heartburn: Secondary | ICD-10-CM

## 2018-01-05 DIAGNOSIS — A048 Other specified bacterial intestinal infections: Secondary | ICD-10-CM

## 2018-01-05 MED ORDER — CLARITHROMYCIN 500 MG PO TABS: 500.0000 mg | ORAL_TABLET | Freq: Two times a day (BID) | ORAL | 0 refills | Status: AC

## 2018-01-05 MED ORDER — AMOXICILLIN 500 MG PO TABS: 1000.0000 mg | ORAL_TABLET | Freq: Two times a day (BID) | ORAL | 0 refills | Status: AC

## 2018-01-05 MED ORDER — PAROXETINE HCL 10 MG PO TABS: 20.0000 mg | ORAL_TABLET | Freq: Every day | ORAL | 2 refills | Status: DC

## 2018-01-05 MED ORDER — OMEPRAZOLE 40 MG PO CPDR: 40.0000 mg | DELAYED_RELEASE_CAPSULE | Freq: Two times a day (BID) | ORAL | 0 refills | Status: DC

## 2018-01-05 MED ORDER — LIDOCAINE-PRILOCAINE 2.5-2.5 % EX KIT: PACK | Freq: Once | CUTANEOUS | 5 refills | Status: DC

## 2018-01-05 MED ORDER — LIDOCAINE-PRILOCAINE 2.5-2.5 % EX KIT: PACK | Freq: Once | CUTANEOUS | 5 refills | Status: AC

## 2018-01-05 NOTE — Progress Notes (Signed)
Subjective:  Patient ID: Drew Levine, male    DOB: 12/17/1985  Age: 32 y.o. MRN: [id number]  CC: f/u epigastric pain   HPI Drew Levine is a 32 y.o. male with a medical history of closed nondisplaced fracture of base of fifth metacarpal bone of right hand presents to f/u on epigastric pain, heartburn, and premature ejaculation. He was prescribed omeprazole for epigastric pain and heartburn. Feels 50% better when taking omeprazole. His work up revealed positive H pylori IgG. Says he was not called about results and [facility] did not call him to pick up his antibiotics. Does not endorse hematochezia or melena.     Reports very little to no prolongation of ejaculation with Paroxetine. Has been taking daily as directed. Does not report side effects. Does not endorse any other symptoms or complaints.       Outpatient Medications Prior to Visit  Medication Sig Dispense Refill  . omeprazole (PRILOSEC) 40 MG capsule Take 1 capsule (40 mg total) by mouth 2 (two) times daily. (Patient not taking: Reported on 01/05/2018) 40 capsule 0  . PARoxetine (PAXIL) 10 MG tablet Take 1 tablet (10 mg total) by mouth daily. (Patient not taking: Reported on 01/05/2018) 30 tablet 2   No facility-administered medications prior to visit.      ROS Review of Systems  Constitutional: Negative for chills, fever and malaise/fatigue.  Eyes: Negative for blurred vision.  Respiratory: Negative for shortness of breath.   Cardiovascular: Negative for chest pain and palpitations.  Gastrointestinal: Positive for heartburn. Negative for abdominal pain and nausea.  Genitourinary: Negative for dysuria and hematuria.       Premature ejaculation  Musculoskeletal: Negative for joint pain and myalgias.  Skin: Negative for rash.  Neurological: Negative for tingling and headaches.  Psychiatric/Behavioral: Negative for depression. The patient is not nervous/anxious.     Objective:  BP 119/77 (BP Location: Left  Arm, Patient Position: Sitting, Cuff Size: Normal)   Pulse 61   Temp 97.7 F (36.5 C) (Oral)   Ht 5\' 4"  (1.626 m)   Wt 171 lb 9.6 oz (77.8 kg)   SpO2 92%   BMI 29.46 kg/m   BP/Weight 01/05/2018 12/08/2017 11/22/2017  Systolic BP 119 119 136  Diastolic BP 77 67 74  Wt. (Lbs) 171.6 167 -  BMI 29.46 28.67 -      Physical Exam  Constitutional: He is oriented to person, place, and time.  Well developed, well nourished, NAD, polite  HENT:  Head: Normocephalic and atraumatic.  Eyes: No scleral icterus.  Neck: Normal range of motion. Neck supple. No thyromegaly present.  Cardiovascular: Normal rate, regular rhythm and normal heart sounds.  Pulmonary/Chest: Effort normal and breath sounds normal.  Abdominal: Soft. Bowel sounds are normal. There is no tenderness.  Musculoskeletal: He exhibits no edema.  Neurological: He is alert and oriented to person, place, and time.  Skin: Skin is warm and dry. No rash noted. No erythema. No pallor.  Psychiatric: He has a normal mood and affect. His behavior is normal. Thought content normal.  Vitals reviewed.    Assessment & Plan:    1. H. pylori infection - amoxicillin (AMOXIL) 500 MG tablet; Take 2 tablets (1,000 mg total) by mouth 2 (two) times daily for 14 days.  Dispense: 56 tablet; Refill: 0 - clarithromycin (BIAXIN) 500 MG tablet; Take 1 tablet (500 mg total) by mouth 2 (two) times daily for 14 days.  Dispense: 28 tablet; Refill: 0  2. Premature ejaculation - PARoxetine (  PAXIL) 10 MG tablet; Take 2 tablets (20 mg total) by mouth daily.  Dispense: 60 tablet; Refill: 2 - lidocaine-prilocaine (AGONEAZE) cream; Apply topically once for 1 dose.  Dispense: 1 each; Refill: 5  3. Heartburn - omeprazole (PRILOSEC) 40 MG capsule; Take 1 capsule (40 mg total) by mouth 2 (two) times daily.  Dispense: 40 capsule; Refill: 0   Meds ordered this encounter  Medications  . amoxicillin (AMOXIL) 500 MG tablet    Sig: Take 2 tablets (1,000 mg total)  by mouth 2 (two) times daily for 14 days.    Dispense:  56 tablet    Refill:  0    Order Specific Question:   Supervising Provider    Answer:   Hoy RegisterNEWLIN, ENOBONG [[id number]]  . clarithromycin (BIAXIN) 500 MG tablet    Sig: Take 1 tablet (500 mg total) by mouth 2 (two) times daily for 14 days.    Dispense:  28 tablet    Refill:  0    Order Specific Question:   Supervising Provider    Answer:   Hoy RegisterNEWLIN, ENOBONG [[id number]]  . DISCONTD: lidocaine-prilocaine (AGONEAZE) cream    Sig: Apply topically once for 1 dose.    Dispense:  1 each    Refill:  5    Order Specific Question:   Supervising Provider    Answer:   Hoy RegisterNEWLIN, ENOBONG [[id number]]  . DISCONTD: PARoxetine (PAXIL) 10 MG tablet    Sig: Take 2 tablets (20 mg total) by mouth daily.    Dispense:  60 tablet    Refill:  2    Order Specific Question:   Supervising Provider    Answer:   Hoy RegisterNEWLIN, ENOBONG [[id number]]  . omeprazole (PRILOSEC) 40 MG capsule    Sig: Take 1 capsule (40 mg total) by mouth 2 (two) times daily.    Dispense:  40 capsule    Refill:  0    Order Specific Question:   Supervising Provider    Answer:   Hoy RegisterNEWLIN, ENOBONG [[id number]]  . PARoxetine (PAXIL) 10 MG tablet    Sig: Take 2 tablets (20 mg total) by mouth daily.    Dispense:  60 tablet    Refill:  2    Order Specific Question:   Supervising Provider    Answer:   Hoy RegisterNEWLIN, ENOBONG [[id number]]  . lidocaine-prilocaine (AGONEAZE) cream    Sig: Apply topically once for 1 dose.    Dispense:  1 each    Refill:  5    Order Specific Question:   Supervising Provider    Answer:   Hoy RegisterNEWLIN, ENOBONG [[id number]]    Follow-up: Return in about 4 weeks (around 02/02/2018) for Premature ejaculation.   Loletta Specteroger David Gennifer Potenza PA

## 2018-01-05 NOTE — Patient Instructions (Signed)
Infeccin por Helicobacter Pylori (Helicobacter Pylori Infection) La infeccin por Helicobacter pylori es una infeccin en el estmago que es causada por la bacteria Helicobacter pylori (H. pylori). Este tipo de bacteria vive frecuentemente en el revestimiento del estmago. La infeccin puede causar lceras e irritacin (gastritis) en algunas personas. Es la causa ms comn de lceras en el estmago (lcera gstrica) y en la parte superior del intestino (lcera duodenal). Tener esta infeccin tambin puede aumentar el riesgo de cncer de estmago y un tipo de cncer de los glbulos blancos (linfoma) que afecta al estmago. CAUSAS H. pylori es un tipo de bacteria que se encuentra frecuentemente en el estmago de las personas saludables. La bacteria puede pasar de una persona a otra por contacto a travs de las heces o la saliva. No se sabe por qu algunas personas desarrollan lceras, gastritis o cncer a partir de la infeccin. FACTORES DE RIESGO Es ms probable que esta afeccin se manifieste en las personas que:  Tienen familiares con esta infeccin.  Viven con muchas otras personas; por ejemplo, en un dormitorio.  Son de origen africano, hispano o asitico. SNTOMAS La mayora de las personas con esta infeccin no tienen sntomas. Si tiene sntomas, estos pueden incluir los siguientes:  Acidez estomacal.  Dolor de estmago.  Nuseas.  Vmitos.  Sangre en el vmito.  Prdida del apetito.  Mal aliento. DIAGNSTICO Esta afeccin se puede diagnosticar en funcin de los sntomas, un examen fsico y diferentes pruebas. Podrn solicitarle otros estudios, por ejemplo:  Anlisis de sangre o pruebas de materia fecal para verificar las protenas (anticuerpos) que el cuerpo puede producir en respuesta a las bacterias. Estas pruebas son la mejor manera de confirmar el diagnstico.  Una prueba de aliento para verificar el tipo de gas que la bacteria H. pylori libera despus de descomponer una  sustancia llamada urea. Para la prueba, se le pide que beba urea. Esta prueba se realiza frecuentemente despus del tratamiento para saber si el tratamiento funcion.  Un procedimiento en el que un tubo delgado y flexible con una pequea cmara en el extremo se coloca en el estmago y el intestino superior (endoscopia superior). El mdico tambin puede tomar muestras de tejido (biopsia) para realizar pruebas de H. pylori y cncer. TRATAMIENTO El tratamiento para esta afeccin generalmente implica tomar una combinacin de medicamentos (terapia triple) durante un par de semanas. La terapia triple incluye un medicamento para reducir el cido en el estmago y dos tipos de antibiticos. Se aprobaron muchas combinaciones de frmacos para el tratamiento. El tratamiento generalmente mata a la H. pylori y reduce el riesgo de cncer. Despus del tratamiento, es posible que deba volver a realizarse una prueba de H. pylori. En algunos casos, es posible que sea necesario repetir el tratamiento. INSTRUCCIONES PARA EL CUIDADO EN EL HOGAR  Tome los medicamentos de venta libre y los recetados solamente como se lo haya indicado el mdico.  Tome los antibiticos como se lo haya indicado el mdico. No deje de tomar los antibiticos aunque comience a sentirse mejor.  Puede retomar todas sus actividades habituales y continuar su dieta habitual.  Tome estas medidas para evitar infecciones futuras: ? Lvese las manos con frecuencia. ? Asegrese de que los alimentos que consume se hayan preparado adecuadamente. ? Beba agua solamente de fuentes limpias.  Concurra a todas las visitas de control como se lo haya indicado el mdico. Esto es importante. SOLICITE ATENCIN MDICA SI:  Los sntomas no mejoran.  Los sntomas regresan despus del tratamiento. Esta   informacin no tiene como fin reemplazar el consejo del mdico. Asegrese de hacerle al mdico cualquier pregunta que tenga. Document Released: 05/27/2015 Document  Revised: 05/27/2015 Document Reviewed: 02/14/2014 Elsevier Interactive Patient Education  2018 Elsevier Inc.  

## 2018-02-02 ENCOUNTER — Other Ambulatory Visit: Payer: Self-pay

## 2018-02-02 ENCOUNTER — Encounter (INDEPENDENT_AMBULATORY_CARE_PROVIDER_SITE_OTHER): Payer: Self-pay | Admitting: Physician Assistant

## 2018-02-02 ENCOUNTER — Ambulatory Visit (INDEPENDENT_AMBULATORY_CARE_PROVIDER_SITE_OTHER): Payer: Self-pay | Admitting: Physician Assistant

## 2018-02-02 DIAGNOSIS — R12 Heartburn: Secondary | ICD-10-CM

## 2018-02-02 DIAGNOSIS — F524 Premature ejaculation: Secondary | ICD-10-CM

## 2018-02-02 MED ORDER — OMEPRAZOLE 40 MG PO CPDR: 40.0000 mg | DELAYED_RELEASE_CAPSULE | Freq: Every day | ORAL | 1 refills | Status: DC

## 2018-02-02 MED ORDER — PAROXETINE HCL 20 MG PO TABS: 20.0000 mg | ORAL_TABLET | Freq: Every day | ORAL | 5 refills | Status: DC

## 2018-02-02 NOTE — Progress Notes (Signed)
Subjective:  Patient ID: Drew Levine, male    DOB: 1985/07/29  Age: 32 y.o. MRN: [id number]  CC: f/u premature ejaculation  HPI Drew BudSaul Herrera Moralesis a 31 y.o.malewith a medical history of H pylori infection, premature ejaculation, and closed nondisplaced fracture of base of fifth metacarpal bone of right handpresents to f/u on premature ejaculation. Last seen here nearly once month ago. Reported little effect with paroxetine 10 mg. Had paroxetine increased to 20 mg and prescribed Agoneaze. Was never dispensed Agoneaze but is taking Paroxetine 20 mg with very good results. He is able to control his premature ejaculation and says the wife is satisfied with their sex life. Pt denies side effects or any other complaints/symptoms. Requests refills. Also requests refill for omeprazole "in case" he gets heartburn again.    Outpatient Medications Prior to Visit  Medication Sig Dispense Refill  . PARoxetine (PAXIL) 10 MG tablet Take 2 tablets (20 mg total) by mouth daily. 60 tablet 2  . omeprazole (PRILOSEC) 40 MG capsule Take 1 capsule (40 mg total) by mouth 2 (two) times daily. (Patient not taking: Reported on 02/02/2018) 40 capsule 0   No facility-administered medications prior to visit.      ROS Review of Systems  Constitutional: Negative for chills, fever and malaise/fatigue.  Eyes: Negative for blurred vision.  Respiratory: Negative for shortness of breath.   Cardiovascular: Negative for chest pain and palpitations.  Gastrointestinal: Negative for abdominal pain and nausea.  Genitourinary: Negative for dysuria and hematuria.  Musculoskeletal: Negative for joint pain and myalgias.  Skin: Negative for rash.  Neurological: Negative for tingling and headaches.  Psychiatric/Behavioral: Negative for depression. The patient is not nervous/anxious.     Objective:  BP 110/68 (BP Location: Left Arm, Patient Position: Sitting, Cuff Size: Normal)   Pulse 61   Temp 97.8 F (36.6  C) (Oral)   Ht 5\' 4"  (1.626 m)   Wt 173 lb 9.6 oz (78.7 kg)   SpO2 94%   BMI 29.80 kg/m   BP/Weight 02/02/2018 01/05/2018 12/08/2017  Systolic BP 110 119 119  Diastolic BP 68 77 67  Wt. (Lbs) 173.6 171.6 167  BMI 29.8 29.46 28.67      Physical Exam Vitals signs reviewed.  Constitutional:      Comments: Well developed, well nourished, NAD, polite  HENT:     Head: Normocephalic and atraumatic.  Eyes:     General: No scleral icterus. Neck:     Musculoskeletal: Normal range of motion and neck supple.     Thyroid: No thyromegaly.  Cardiovascular:     Rate and Rhythm: Normal rate and regular rhythm.     Heart sounds: Normal heart sounds.  Pulmonary:     Effort: Pulmonary effort is normal.     Breath sounds: Normal breath sounds.  Skin:    General: Skin is warm and dry.     Coloration: Skin is not pale.     Findings: No erythema or rash.  Neurological:     Mental Status: He is alert and oriented to person, place, and time.  Psychiatric:        Mood and Affect: Mood normal.        Behavior: Behavior normal.        Thought Content: Thought content normal.      Assessment & Plan:    1. Premature ejaculation - Refill PARoxetine (PAXIL) 20 MG tablet; Take 1 tablet (20 mg total) by mouth daily.  Dispense: 30 tablet; Refill: 5  2. Heartburn - Refill omeprazole (PRILOSEC) 40 MG capsule; Take 1 capsule (40 mg total) by mouth daily.  Dispense: 30 capsule; Refill: 1   Meds ordered this encounter  Medications  . omeprazole (PRILOSEC) 40 MG capsule    Sig: Take 1 capsule (40 mg total) by mouth daily.    Dispense:  30 capsule    Refill:  1    Order Specific Question:   Supervising Provider    Answer:   Hoy Register [[id number]]  . PARoxetine (PAXIL) 20 MG tablet    Sig: Take 1 tablet (20 mg total) by mouth daily.    Dispense:  30 tablet    Refill:  5    Order Specific Question:   Supervising Provider    Answer:   Hoy Register [[id number]]    Follow-up: Return in about 6  months (around 08/04/2018) for premature ejaculation.   Loletta Specter PA

## 2018-02-02 NOTE — Patient Instructions (Signed)
Acidez estomacal  Heartburn  La acidez estomacal es un tipo de dolor o malestar que puede sentirse en la garganta o en el pecho. Con frecuencia se describe como un dolor urente (ardor). Tambin puede causar mal sabor en la boca que se siente cido. La acidez estomacal puede sentirse con mayor intensidad cuando usted se acuesta o se inclina, y a menudo empeora durante la noche. La acidez estomacal puede deberse al contenido del estmago que sube por el esfago (reflujo).  Siga estas indicaciones en su casa:  Comida y bebida     Evite ciertos alimentos y bebidas como se lo haya indicado el mdico. Estos pueden incluir:  ? Caf y t (con o sin cafena).  ? Bebidas que contengan alcohol.  ? Bebidas energticas y deportivas.  ? Bebidas gaseosas o refrescos.  ? Chocolate y cacao.  ? Menta y esencia de menta.  ? Ajo y cebolla.  ? Rbano picante.  ? Alimentos condimentados, picantes y cidos, por ejemplo, todos los tipos de pimientas, chile en polvo, curry en polvo, vinagre, salsas picantes y salsa barbacoa.  ? Ctricos y sus jugos, por ejemplo, naranjas, limones y limas.  ? Alimentos a base de tomate, como salsa de tomate, chile, salsa picante y pizza con salsa de tomate.  ? Alimentos fritos y grasos, como donas, papas fritas y aderezos ricos en grasas.  ? Carnes con alto contenido de grasa, como salchichas, y cortes de carnes rojas y blancas con mucha grasa, por ejemplo, chuletas o costillas, embutidos, jamn y tocino.  ? Productos lcteos ricos en grasas, como leche entera, manteca y queso crema.   Haga comidas pequeas y frecuentes durante el da en lugar de comidas abundantes.   Evite beber grandes cantidades de lquidos con las comidas.   Evite comer 2 o 3horas antes de acostarse.   Evite recostarse inmediatamente despus de comer.   No haga ejercicios enseguida despus de comer.  Estilo de vida          Si tiene sobrepeso, baje hasta llegar a un peso saludable para usted. Pdale consejos al mdico para bajar  de peso de manera segura.   No consuma ningn producto que contenga nicotina o tabaco, como cigarrillos, cigarrillos electrnicos y tabaco de mascar. Estos pueden empeorar los sntomas. Si necesita ayuda para dejar de fumar, consulte al mdico.   Use ropa suelta. No use nada apretado alrededor de la cintura que haga presin sobre el abdomen.   Levante (eleve) la cabecera de la cama aproximadamente 6pulgadas (15cm) para dormir.   Trate de reducir el nivel de estrs con actividades tales como el yoga o la meditacin. Si necesita ayuda para reducir el nivel de estrs, consulte al mdico.  Indicaciones generales   Est atento a cualquier cambio en los sntomas.   Tome los medicamentos de venta libre y los recetados solamente como se lo haya indicado el mdico.  ? No tome aspirina, ibuprofeno ni otros antiinflamatorios no esteroideos (AINE) a menos que el mdico se lo indique.  ? Deje de tomar los medicamentos solamente como se lo haya indicado el mdico. Si deja de tomar algunos medicamentos demasiado rpido, los sntomas pueden empeorar.   Concurra a todas las visitas de seguimiento como se lo haya indicado el mdico. Esto es importante.  Comunquese con un mdico si:   Aparecen nuevos sntomas.   Baja de peso sin causa aparente.   Tiene dificultad para tragar, o le duele cuando traga.   Tiene tos persistente o sibilancias.     Los sntomas no mejoran con el tratamiento.   Tiene acidez estomacal con frecuencia durante ms de 2semanas.  Solicite ayuda inmediatamente si:   Siente dolor en los brazos, el cuello, la mandbula, los dientes o la espalda.   Se siente transpirado, mareado o tiene una sensacin de desvanecimiento.   Siente falta de aire o dolor en el pecho.   Vomita y el vmito tiene un aspecto similar a la sangre o a los posos de caf.   Las heces son sanguinolentas o negras.  Estos sntomas pueden representar un problema grave que constituye una emergencia. No espere a ver si los sntomas  desaparecen. Solicite atencin mdica de inmediato. Comunquese con el servicio de emergencias de su localidad (911 en los [place]). No conduzca por sus propios medios hasta el hospital.  Resumen   La acidez estomacal es un tipo de dolor o malestar que puede sentirse en la garganta o en el pecho. Con frecuencia se describe como un dolor urente (ardor). Tambin puede causar mal sabor en la boca que se siente cido.   Evite ciertos alimentos y bebidas como se lo haya indicado el mdico.   Tome los medicamentos de venta libre y los recetados solamente como se lo haya indicado el mdico. No tome aspirina, ibuprofeno ni otros antiinflamatorios no esteroideos (AINE) a menos que el mdico se lo indique.   Comunquese con un mdico si los sntomas no mejoran o si empeoran.  Esta informacin no tiene como fin reemplazar el consejo del mdico. Asegrese de hacerle al mdico cualquier pregunta que tenga.  Document Released: 10/15/2010 Document Revised: 08/11/2017 Document Reviewed: 08/11/2017  Elsevier Interactive Patient Education  2019 Elsevier Inc.

## 2018-03-13 ENCOUNTER — Emergency Department (HOSPITAL_COMMUNITY): Payer: Self-pay

## 2018-03-13 ENCOUNTER — Encounter (HOSPITAL_COMMUNITY): Payer: Self-pay | Admitting: Emergency Medicine

## 2018-03-13 ENCOUNTER — Emergency Department (HOSPITAL_COMMUNITY)
Admission: EM | Admit: 2018-03-13 | Discharge: 2018-03-13 | Disposition: A | Discharge status: Home / Self Care | Payer: Self-pay | Attending: Emergency Medicine | Admitting: Emergency Medicine

## 2018-03-13 DIAGNOSIS — J189 Pneumonia, unspecified organism: Secondary | ICD-10-CM

## 2018-03-13 DIAGNOSIS — R590 Localized enlarged lymph nodes: Secondary | ICD-10-CM

## 2018-03-13 DIAGNOSIS — D7389 Other diseases of spleen: Secondary | ICD-10-CM

## 2018-03-13 DIAGNOSIS — D739 Disease of spleen, unspecified: Secondary | ICD-10-CM | POA: Insufficient documentation

## 2018-03-13 DIAGNOSIS — Z79899 Other long term (current) drug therapy: Secondary | ICD-10-CM | POA: Insufficient documentation

## 2018-03-13 LAB — COMPREHENSIVE METABOLIC PANEL
ALK PHOS: 59 U/L (ref 38–126)
ALT: 27 U/L (ref 0–44)
AST: 23 U/L (ref 15–41)
Albumin: 4.1 g/dL (ref 3.5–5.0)
Anion gap: 10 (ref 5–15)
BUN: 14 mg/dL (ref 6–20)
CALCIUM: 9 mg/dL (ref 8.9–10.3)
CO2: 22 mmol/L (ref 22–32)
Chloride: 103 mmol/L (ref 98–111)
Creatinine, Ser: 0.59 mg/dL — ABNORMAL LOW (ref 0.61–1.24)
GFR calc Af Amer: >60 mL/min (ref >60)
GFR calc non Af Amer: >60 mL/min (ref >60)
Glucose, Bld: 117 mg/dL — ABNORMAL HIGH (ref 70–99)
Potassium: 3.8 mmol/L (ref 3.5–5.1)
Sodium: 135 mmol/L (ref 135–145)
Total Bilirubin: 0.9 mg/dL (ref 0.3–1.2)
Total Protein: 7.2 g/dL (ref 6.5–8.1)

## 2018-03-13 LAB — CBC WITH DIFFERENTIAL/PLATELET
Abs Immature Granulocytes: 0.03 10*3/uL (ref 0.00–0.07)
Basophils Absolute: 0 10*3/uL (ref 0.0–0.1)
Basophils Relative: 1 %
Eosinophils Absolute: 0.3 10*3/uL (ref 0.0–0.5)
Eosinophils Relative: 3 %
HCT: 50.7 % (ref 39.0–52.0)
HEMOGLOBIN: 17 g/dL (ref 13.0–17.0)
Immature Granulocytes: 0 %
Lymphocytes Relative: 24 %
Lymphs Abs: 2.1 10*3/uL (ref 0.7–4.0)
MCH: 29.7 pg (ref 26.0–34.0)
MCHC: 33.5 g/dL (ref 30.0–36.0)
MCV: 88.5 fL (ref 80.0–100.0)
MONOS PCT: 7 %
Monocytes Absolute: 0.6 10*3/uL (ref 0.1–1.0)
Neutro Abs: 5.5 10*3/uL (ref 1.7–7.7)
Neutrophils Relative %: 65 %
Platelets: 297 10*3/uL (ref 150–400)
RBC: 5.73 MIL/uL (ref 4.22–5.81)
RDW: 12.2 % (ref 11.5–15.5)
WBC: 8.6 10*3/uL (ref 4.0–10.5)
nRBC: 0 % (ref 0.0–0.2)

## 2018-03-13 LAB — LIPASE, BLOOD: LIPASE: 37 U/L (ref 11–51)

## 2018-03-13 MED ORDER — ONDANSETRON HCL 4 MG/2ML IJ SOLN
4.0000 mg | Freq: Once | INTRAMUSCULAR | Status: AC
  Administered 2018-03-13: 4 mg via INTRAVENOUS
  Filled 2018-03-13: qty 2

## 2018-03-13 MED ORDER — IOHEXOL 300 MG/ML  SOLN
100.0000 mL | Freq: Once | INTRAMUSCULAR | Status: AC | PRN
  Administered 2018-03-13: 100 mL via INTRAVENOUS

## 2018-03-13 MED ORDER — SODIUM CHLORIDE 0.9 % IV BOLUS
1000.0000 mL | Freq: Once | INTRAVENOUS | Status: AC
  Administered 2018-03-13: 1000 mL via INTRAVENOUS

## 2018-03-13 MED ORDER — MORPHINE SULFATE (PF) 4 MG/ML IV SOLN
4.0000 mg | Freq: Once | INTRAVENOUS | Status: AC
  Administered 2018-03-13: 4 mg via INTRAVENOUS
  Filled 2018-03-13: qty 1

## 2018-03-13 MED ORDER — AZITHROMYCIN 250 MG PO TABS
500.0000 mg | ORAL_TABLET | Freq: Once | ORAL | Status: AC
  Administered 2018-03-13: 500 mg via ORAL
  Filled 2018-03-13: qty 2

## 2018-03-13 MED ORDER — AZITHROMYCIN 250 MG PO TABS: 250.0000 mg | ORAL_TABLET | Freq: Every day | ORAL | 0 refills | Status: DC

## 2018-03-13 NOTE — ED Triage Notes (Signed)
Pt reports upper abdominal pain x4 days. Denies any other symptoms.

## 2018-03-13 NOTE — ED Provider Notes (Signed)
[facility] EMERGENCY DEPARTMENT Provider Note   CSN: [id number] Arrival date & time: 03/13/18  0820     History   Chief Complaint Chief Complaint  Patient presents with  . Abdominal Pain    HPI Drew Levine is a 33 y.o. male.  HPI Patient is a 33 year old male who presents the emergency department with 4 days of generalized upper abdominal pain with some nausea.  Denies vomiting.  No chills.   History reviewed. No pertinent past medical history.  There are no active problems to display for this patient.   Past Surgical History:  Procedure Laterality Date  . LEG SURGERY          Home Medications    Prior to Admission medications   Medication Sig Start Date End Date Taking? Authorizing Provider  azithromycin (ZITHROMAX) 250 MG tablet Take 1 tablet (250 mg total) by mouth daily. Take first 2 tablets together, then 1 every day until finished. 03/13/18   Azalia Bilis, MD  omeprazole (PRILOSEC) 40 MG capsule Take 1 capsule (40 mg total) by mouth daily. 02/02/18   Loletta Specter, PA-C  PARoxetine (PAXIL) 10 MG tablet Take 2 tablets (20 mg total) by mouth daily. 01/05/18   Loletta Specter, PA-C  PARoxetine (PAXIL) 20 MG tablet Take 1 tablet (20 mg total) by mouth daily. 02/02/18   Loletta Specter, PA-C    Family History No family history on file.  Social History Social History   Tobacco Use  . Smoking status: Never Smoker  . Smokeless tobacco: Never Used  Substance Use Topics  . Alcohol use: No  . Drug use: No     Allergies   Patient has no known allergies.   Review of Systems Review of Systems  All other systems reviewed and are negative.    Physical Exam Updated Vital Signs BP 121/90   Pulse 78   Temp 97.7 F (36.5 C) (Oral)   Resp 18   Ht 5\' 6"  (1.676 m)   Wt 68 kg   SpO2 96%   BMI 24.21 kg/m   Physical Exam Vitals signs and nursing note reviewed.  Constitutional:      Appearance: He is  well-developed.  HENT:     Head: Normocephalic and atraumatic.  Neck:     Musculoskeletal: Normal range of motion.  Cardiovascular:     Rate and Rhythm: Normal rate and regular rhythm.     Heart sounds: Normal heart sounds.  Pulmonary:     Effort: Pulmonary effort is normal. No respiratory distress.     Breath sounds: Normal breath sounds.  Abdominal:     General: There is no distension.     Palpations: Abdomen is soft.     Tenderness: There is no abdominal tenderness.  Musculoskeletal: Normal range of motion.  Skin:    General: Skin is warm and dry.  Neurological:     Mental Status: He is alert and oriented to person, place, and time.  Psychiatric:        Judgment: Judgment normal.      ED Treatments / Results  Labs (all labs ordered are listed, but only abnormal results are displayed) Labs Reviewed  COMPREHENSIVE METABOLIC PANEL - Abnormal; Notable for the following components:      Result Value   Glucose, Bld 117 (*)    Creatinine, Ser 0.59 (*)    All other components within normal limits  CBC WITH DIFFERENTIAL/PLATELET  LIPASE, BLOOD  URINALYSIS, ROUTINE W REFLEX  MICROSCOPIC    EKG None  Radiology Ct Chest Wo Contrast  Result Date: 03/13/2018 CLINICAL DATA:  Enlarged lymph nodes seen on recent CT abdomen, for further characterization EXAM: CT CHEST WITHOUT CONTRAST TECHNIQUE: Multidetector CT imaging of the chest was performed following the standard protocol without IV contrast. COMPARISON:  CT abdomen 03/13/2018 FINDINGS: Cardiovascular: Mild cardiomegaly. Mediastinum/Nodes: An AP window lymph node measures 1.2 cm in short axis. Scattered small paratracheal lymph nodes are present. There is suspicion for bilateral hilar adenopathy although the lymph nodes are a similar density to surrounding vascular structures, with indistinct fat planes along their margins. A left infrahilar lymph node measures 1.3 cm in short axis on image 66/3. A right eccentric subcarinal lymph  node measures 1.6 cm in short axis on image 66/3. Small bilateral axillary lymph nodes are present. Lungs/Pleura: Mosaic attenuation with scattered ground-glass densities in the lungs, likely infiltrative. Bilateral abnormal airway thickening. Potential central peribronchovascular nodularity. Reticulonodular opacities in the right middle lobe along with some mild pleural-based nodularity. No significant pleural effusion. Upper Abdomen: Please refer to detailed report from CT abdomen 03/13/2018 Musculoskeletal: Unremarkable IMPRESSION: 1. Widespread hazy infiltrative ground-glass opacities in both lungs with scattered reticulonodular opacity and airway thickening. The appearance tends to favor atypical pneumonia over acute pulmonary edema, despite the presence of the mild cardiomegaly. 2. Mild to moderate adenopathy in the chest. Some of this may have a peribronchovascular component, raise the possibility of sarcoidosis, although the paratracheal component of the adenopathy is less striking than is typically encountered in sarcoidosis. Lymphoma can cause a similar adenopathy in the chest, but would not typically be associated with the pulmonary findings, which make the adenopathy more likely to be reactive to an atypical infectious process. The non-specific hypoenhancing lesions in the spleen are concerning, but there is no splenomegaly to further suggest lymphoma. I would suggest presumptive treatment for atypical infectious process with follow up chest radiography and a low threshold for follow up CT imaging to ensure resolution. Consider obtaining an angiotensin converting enzyme (ACE) level as corroborated of information regarding the possibility of sarcoidosis. 3. The mild cardiomegaly is abnormal for the patient's young age, and might prompt echocardiography. Electronically Signed   By: Gaylyn Rong M.D.   On: 03/13/2018 14:13   Ct Abdomen Pelvis W Contrast  Result Date: 03/13/2018 CLINICAL DATA:   Upper abdominal pain for 4 days EXAM: CT ABDOMEN AND PELVIS WITH CONTRAST TECHNIQUE: Multidetector CT imaging of the abdomen and pelvis was performed using the standard protocol following bolus administration of intravenous contrast. CONTRAST:  OMNIPAQUE IOHEXOL 300 MG/ML  SOLN COMPARISON:  None. FINDINGS: Lower chest: Mild cardiomegaly. Suspected infrahilar and subcarinal adenopathy although only partially included on today's exam. Hazy accentuation of lung density, with some mild dependent atelectasis in both lower lobes. Hepatobiliary: Unremarkable Pancreas: Unremarkable Spleen: Ill-defined scattered hypodense lesions in the spleen. One such lesion medially in the spleen on image 24/3 measures 1.8 cm in diameter. There are least 10 hypodense lesions in the spleen. Adrenals/Urinary Tract: A 5 mm hypodense lesion in the left kidney lower pole is technically nonspecific but statistically highly likely to represent a small benign cyst. Urinary bladder unremarkable. Adrenal glands normal. Stomach/Bowel: Mild lower rectal wall thickening is nonspecific. The appendix appears normal. Otherwise unremarkable. Vascular/Lymphatic: Unremarkable Reproductive: Unremarkable Other: Trace free fluid in the right paracolic gutter, as on image 37/6. Musculoskeletal: Disc bulge at L4-5 and possibly L5-S1. IMPRESSION: 1. Suspected lower thoracic adenopathy in the infrahilar and subcarinal regions, although only  partially included. CT of the chest should be considered for more definitive assessment. 2. Hazy densities in the lung bases, query low-grade edema, alveolitis, or atypical pneumonia. 3. There are at least 10 ill-defined hypodense lesions in the spleen. Possibilities include Littoral cell angioma, lymphoma, hemangiomatosis, sarcoidosis, splenic peliosis, or less likely infection. 4. Mild cardiomegaly. 5. Mild lower rectal wall thickening is nonspecific and might simply be due to peristaltic activity, especially given that  the patient's reported pain is in the upper abdomen and not in the pelvis. Electronically Signed   By: Gaylyn RongWalter  Liebkemann M.D.   On: 03/13/2018 12:25    Procedures Procedures (including critical care time)  Medications Ordered in ED Medications  azithromycin (ZITHROMAX) tablet 500 mg (has no administration in time range)  morphine 4 MG/ML injection 4 mg (4 mg Intravenous Given 03/13/18 1022)  ondansetron (ZOFRAN) injection 4 mg (4 mg Intravenous Given 03/13/18 1022)  sodium chloride 0.9 % bolus 1,000 mL (0 mLs Intravenous Stopped 03/13/18 1204)  iohexol (OMNIPAQUE) 300 MG/ML solution 100 mL (100 mLs Intravenous Contrast Given 03/13/18 1140)     Initial Impression / Assessment and Plan / ED Course  I have reviewed the triage vital signs and the nursing notes.  Pertinent labs & imaging results that were available during my care of the patient were reviewed by me and considered in my medical decision making (see chart for details).     Overall well-appearing here in the emergency department.  No significant upper respiratory symptoms.  No peritonitis on abdominal exam.  Initial abdominal CT demonstrates some abnormal pathology in the spleen as well as some possible hilar lymphadenopathy.  CT chest obtained as well.  Please see CT chest abdomen pelvis radiology read for complete details.  Is a very atypical presentation and abnormal reads of his CT chest abdomen and pelvis.  I spoke with the patient at length with the interpreter in regards to the importance of close follow-up.  We will treat him with a course of azithromycin for possible atypical pneumonia but he understands the importance of close follow-up with his primary care physician and the likely need for repeat imaging after an appropriate course of antibiotics.  If his symptoms are not improving or his CT images do not change she will need additional work-up including likely follow-up with a pulmonologist and possibly biopsy.  He  understands that there is more work-up to occur and that the findings today are unclear but definitely abnormal.  At this time I do not think he needs acute hospitalization.  He does report that he is seen Sindy Messingoger Gomez, PA-C at [facility] before in the past.  He will call his office for follow-up.  If he is having difficulty with follow-up with Sindy Messingoger Gomez he has been given [facility] and I have discussed his case with case management who will help assist him with that appointment in the next several days.  Final Clinical Impressions(s) / ED Diagnoses   Final diagnoses:  Atypical pneumonia  Lymphadenopathy, hilar  Splenic lesion    ED Discharge Orders         Ordered    azithromycin (ZITHROMAX) 250 MG tablet  Daily     03/13/18 1556           Azalia Bilisampos, Tamarion Haymond, MD 03/13/18 1601

## 2018-03-13 NOTE — ED Notes (Signed)
Pt transported to CT ?

## 2018-03-13 NOTE — Discharge Instructions (Signed)
Please call Drew Levine for close follow-up  Take the antibiotics as prescribed  You will need additional outpatient work-up and repeat imaging after antibiotics  ++++++++++++++++++++++++++++++++++++++++  Por favor llame a Drew Levine para un seguimiento cercano  [employer] antibiticos segn lo prescrito  Necesitar un trabajo ambulatorio adicional y repetir la toma de imgenes despus de los antibiticos

## 2018-03-13 NOTE — Care Management (Signed)
Consulted by Dr. Patria Maneampos concerning outpatient follow-up, patient is Spanish speaking.  Patient is uninsured and does not have a PCP. ED CM [facility] follow [facility] and schedule an OP primary care appointment. Patient was also instructed in Spanish to contact the clinic (Spanish Speaking staff  Available) Patient is agreeable with transitional care plan, patient denies having difficulties obtaining discharge prescriptions filled.

## 2018-03-14 ENCOUNTER — Telehealth: Payer: Self-pay

## 2018-03-14 NOTE — Telephone Encounter (Signed)
Message received from Michel Bickers, RN CM requesting a hospital follow up appointment for the patient.  Informed her that the patient has an appointment scheduled for 04/06/2018 @ [facility].

## 2018-04-06 ENCOUNTER — Encounter (INDEPENDENT_AMBULATORY_CARE_PROVIDER_SITE_OTHER): Payer: Self-pay | Admitting: Primary Care

## 2018-04-06 ENCOUNTER — Other Ambulatory Visit: Payer: Self-pay

## 2018-04-06 ENCOUNTER — Ambulatory Visit (INDEPENDENT_AMBULATORY_CARE_PROVIDER_SITE_OTHER): Payer: Self-pay | Admitting: Primary Care

## 2018-04-06 VITALS — BP 116/74 | HR 80 | Temp 97.7°F | Ht 66.0 in | Wt 179.0 lb

## 2018-04-06 DIAGNOSIS — B59 Pneumocystosis: Secondary | ICD-10-CM

## 2018-04-06 NOTE — Patient Instructions (Signed)
Neumona extrahospitalaria en los adultos  Community-Acquired Pneumonia, Adult  La neumona es una infeccin en los pulmones. Produce hinchazn en las vas respiratorias de los pulmones. Tambin puede acumularse mucosidad y lquido en el interior de las vas respiratorias.  Un tipo de neumona puede suceder mientras la persona est en un hospital. El otro tipo de la enfermedad puede suceder cuando la persona no est en un hospital (neumona extrahospitalaria).   Cules son las causas?    La causa de esta afeccin son los microbios (virus, bacterias u hongos). Algunos tipos de microbios pueden transmitirse de una persona a otra. Esto puede suceder cuando usted aspira las gotitas que una persona infectada elimina al toser o estornudar.  Qu incrementa el riesgo?  Es ms probable que tenga esta afeccin si:   Tiene una enfermedad a largo plazo (crnica), como las siguientes:  ? Enfermedad pulmonar obstructiva crnica (EPOC).  ? Asma.  ? Fibrosis qustica.  ? Insuficiencia cardaca congestiva.  ? Diabetes.  ? Enfermedad renal.   Tiene VIH.   Tienen anemia drepanoctica.   Les extirparon el bazo.   No se cuida bien la boca y los dientes (higiene dental deficiente).   Tiene una afeccin que incrementa el riesgo de respirar las secreciones de la propia boca o nariz.   Tiene debilitado el sistema de defensa del organismo (sistema inmunitario).   Es fumador.   Viaja a zonas donde los microbios que causan esta enfermedad son frecuentes.   Est cerca de ciertos animales o de los lugares donde viven.  Cules son los signos o los sntomas?   Tos seca.   Tos hmeda (productiva).   Fiebre.   Sudoracin.   Dolor en el pecho. Este suele sentirse al respirar profundamente o toser.   Respiracin rpida o dificultad para respirar.   Falta de aire.   Escalofros.   Sensacin de cansancio (fatiga).   Dolores musculares.  Cmo se trata?  El tratamiento de esta afeccin depende de muchos factores. La mayora de los  adultos puede recibir tratamiento en el hogar. En algunos casos, el tratamiento debe realizarse en un hospital. El tratamiento puede incluir:   Medicamentos que se dan por boca o a travs de un tubo intravenoso.   Suministro de oxgeno adicional.   Terapia respiratoria.  En raros casos, el tratamiento de la neumona muy grave puede incluir lo siguiente:   Usar una mquina para ayudarlo a respirar.   Someterse a un procedimiento para extraer lquido de alrededor de los pulmones.  Siga estas instrucciones en su casa:  Medicamentos   Tome los medicamentos de venta libre y los recetados solamente como se lo haya indicado el mdico.  ? Solo tome medicamentos para la tos si no puede dormir bien.   Si le recetaron un antibitico, tmelo como se lo haya indicado el mdico. No deje de tomar el antibitico, aunque comience a sentirse mejor.  Instrucciones generales     Duerma con la cabeza y el cuello levantados (elevados). Para lograrlo, duerma en una reposera o pngase algunas almohadas debajo de la cabeza.   Descanse todo lo que sea necesario. Duerma como mnimo 8 horas todas las noches.   Beba suficiente agua para mantener el pis (la orina) de color amarillo plido.   Siga una dieta saludable que incluya abundantes frutas, verduras, cereales integrales, productos lcteos descremados y protenas magras.   No consuma ningn producto que contenga nicotina o tabaco. Estos incluyen cigarrillos, cigarrillos electrnicos y tabaco para mascar. Si necesita ayuda   para dejar de fumar, consulte al mdico.   Concurra a todas las visitas de seguimiento como se lo haya indicado el mdico. Esto es importante.  Cmo se evita?  Una vacuna puede ayudar a prevenir la neumona. Habitualmente, se recomienda para:   Las personas mayores de 65 aos.   Las personas mayores de 19 aos que:  ? Estn recibiendo tratamiento para el cncer.  ? Tienen una enfermedad pulmonar a largo plazo (crnica).  ? Tienen problemas con el sistema de  defensa del cuerpo.  Tambin puede evitar contraer neumona si toma estas medidas:   Se vacuna contra la gripe todos los aos.   Va al dentista con la frecuencia que le hayan indicado.   Lvese las manos con frecuencia. Use un desinfectante para manos si no dispone de agua y jabn.  Comunquese con un mdico si:   Tiene fiebre.   No puede dormir porque el medicamento para la tos no lo ayuda.  Solicite ayuda inmediatamente si:   Tiene falta de aire y esta empeora.   Aumenta el dolor en el pecho.   La enfermedad empeora. Esto es muy grave si usted:  ? Es un adulto mayor.  ? Tiene debilitado el sistema de defensa del cuerpo.   Tose y escupe sangre.  Resumen   La neumona es una infeccin en los pulmones.   La mayora de los adultos puede recibir tratamiento en el hogar. Algunos necesitarn tratamiento en un hospital.   Beba suficiente agua como para mantener la orina de color amarillo plido.   Duerma como mnimo 8 horas todas las noches.  Esta informacin no tiene como fin reemplazar el consejo del mdico. Asegrese de hacerle al mdico cualquier pregunta que tenga.  Document Released: 07/23/2009 Document Revised: 10/26/2017 Document Reviewed: 10/26/2017  Elsevier Interactive Patient Education  2019 [employer] Inc.

## 2018-04-06 NOTE — Progress Notes (Signed)
Pt complains of headaches Pt complains of chest pain on the right side, last occurred 2-3 days ago  Pt prefers to have any prescriptions needed printed

## 2018-04-06 NOTE — Progress Notes (Signed)
Established Patient Office Visit  Subjective:  Patient ID: Drew Levine, male    DOB: [birth year]  Age: 33 y.o. MRN: [id number]  CC:  Chief Complaint  Patient presents with  . Hospitalization Follow-up    atypical pneumonia    HPI Drew Levine presents for hospital follow up for pneumonia.   No past medical history on file.  Past Surgical History:  Procedure Laterality Date  . LEG SURGERY      No family history on file.  Social History   Socioeconomic History  . Marital status: Significant Other    Spouse name: Not on file  . Number of children: Not on file  . Years of education: Not on file  . Highest education level: Not on file  Occupational History  . Not on file  Social Needs  . Financial resource strain: Not on file  . Food insecurity:    Worry: Not on file    Inability: Not on file  . Transportation needs:    Medical: Not on file    Non-medical: Not on file  Tobacco Use  . Smoking status: Never Smoker  . Smokeless tobacco: Never Used  Substance and Sexual Activity  . Alcohol use: No  . Drug use: No  . Sexual activity: Never  Lifestyle  . Physical activity:    Days per week: Not on file    Minutes per session: Not on file  . Stress: Not on file  Relationships  . Social connections:    Talks on phone: Not on file    Gets together: Not on file    Attends religious service: Not on file    Active member of club or organization: Not on file    Attends meetings of clubs or organizations: Not on file    Relationship status: Not on file  . Intimate partner violence:    Fear of current or ex partner: Not on file    Emotionally abused: Not on file    Physically abused: Not on file    Forced sexual activity: Not on file  Other Topics Concern  . Not on file  Social History Narrative  . Not on file    Outpatient Medications Prior to Visit  Medication Sig Dispense Refill  . omeprazole (PRILOSEC) 40 MG capsule Take 1 capsule (40 mg  total) by mouth daily. 30 capsule 1  . PARoxetine (PAXIL) 20 MG tablet Take 1 tablet (20 mg total) by mouth daily. 30 tablet 5  . PARoxetine (PAXIL) 10 MG tablet Take 2 tablets (20 mg total) by mouth daily. (Patient not taking: Reported on 04/06/2018) 60 tablet 2  . azithromycin (ZITHROMAX) 250 MG tablet Take 1 tablet (250 mg total) by mouth daily. Take first 2 tablets together, then 1 every day until finished. 6 tablet 0   No facility-administered medications prior to visit.     No Known Allergies  ROS Review of Systems  All other systems reviewed and are negative.     Objective:    Physical Exam  Constitutional: He is oriented to person, place, and time. He appears well-developed.  HENT:  Head: Normocephalic.  Eyes: EOM are normal.  Neck: Normal range of motion.  Cardiovascular: Normal rate and regular rhythm.  Pulmonary/Chest: Effort normal and breath sounds normal.  Abdominal: Soft. Bowel sounds are normal.  Musculoskeletal: Normal range of motion.  Neurological: He is alert and oriented to person, place, and time.  Skin: Skin is warm and dry.  Psychiatric: He  has a normal mood and affect.  Vitals reviewed.   BP 116/74 (BP Location: Right Arm, Patient Position: Sitting, Cuff Size: Normal)   Pulse 80   Temp 97.7 F (36.5 C) (Oral)   Ht 5\' 6"  (1.676 m)   Wt 179 lb (81.2 kg)   SpO2 94%   BMI 28.89 kg/m  Wt Readings from Last 3 Encounters:  04/06/18 179 lb (81.2 kg)  03/13/18 150 lb (68 kg)  02/02/18 173 lb 9.6 oz (78.7 kg)     There are no preventive care reminders to display for this patient.  There are no preventive care reminders to display for this patient.  No results found for: TSH Lab Results  Component Value Date   WBC 8.6 03/13/2018   HGB 17.0 03/13/2018   HCT 50.7 03/13/2018   MCV 88.5 03/13/2018   PLT 297 03/13/2018   Lab Results  Component Value Date   NA 135 03/13/2018   K 3.8 03/13/2018   CO2 22 03/13/2018   GLUCOSE 117 (H) 03/13/2018    BUN 14 03/13/2018   CREATININE 0.59 (L) 03/13/2018   BILITOT 0.9 03/13/2018   ALKPHOS 59 03/13/2018   AST 23 03/13/2018   ALT 27 03/13/2018   PROT 7.2 03/13/2018   ALBUMIN 4.1 03/13/2018   CALCIUM 9.0 03/13/2018   ANIONGAP 10 03/13/2018    Asessment & Plan:   Problem List Items Addressed This Visit    None    Visit Diagnoses    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 He went to the ED and was dx with pneumonia completed all medication and has no problems or complaints     No orders of the defined types were placed in this encounter.   Follow-up: Return in about 1 year (around 04/07/2019).    Grayce Sessions, NP

## 2018-04-15 ENCOUNTER — Other Ambulatory Visit (INDEPENDENT_AMBULATORY_CARE_PROVIDER_SITE_OTHER): Payer: Self-pay

## 2018-04-15 DIAGNOSIS — R12 Heartburn: Secondary | ICD-10-CM

## 2018-04-15 MED ORDER — OMEPRAZOLE 40 MG PO CPDR: 40.0000 mg | DELAYED_RELEASE_CAPSULE | Freq: Every day | ORAL | 2 refills | Status: DC

## 2018-04-15 NOTE — Telephone Encounter (Signed)
Received request from pharmacy to refill Omeprazole. Refill has been sent. Maryjean Morn, CMA

## 2018-06-29 ENCOUNTER — Other Ambulatory Visit: Payer: Self-pay

## 2018-06-29 ENCOUNTER — Encounter (HOSPITAL_COMMUNITY): Payer: Self-pay

## 2018-06-29 ENCOUNTER — Ambulatory Visit (HOSPITAL_COMMUNITY)
Admission: EM | Admit: 2018-06-29 | Discharge: 2018-06-29 | Disposition: A | Discharge status: Home / Self Care | Payer: Self-pay | Attending: Family Medicine | Admitting: Family Medicine

## 2018-06-29 DIAGNOSIS — L600 Ingrowing nail: Secondary | ICD-10-CM

## 2018-06-29 MED ORDER — SULFAMETHOXAZOLE-TRIMETHOPRIM 800-160 MG PO TABS: 1.0000 | ORAL_TABLET | Freq: Two times a day (BID) | ORAL | 0 refills | Status: AC

## 2018-06-29 NOTE — ED Triage Notes (Addendum)
Pt presents with complaints of ingrown nail on right foot. Patient reports pain and is curious if we could take it out. Reports history of same. Translator used for triage. Pt is requesting any prescriptions be printed.

## 2018-06-29 NOTE — Discharge Instructions (Addendum)
You may also try soaking your foot in [place] solution twice daily.

## 2018-06-29 NOTE — ED Provider Notes (Signed)
[facility]   [id number] 06/29/18 Arrival Time: 1010  ASSESSMENT & PLAN:  1. Ingrown left greater toenail    Will cover for infection.  To begin: Meds ordered this encounter  Medications  . sulfamethoxazole-trimethoprim (BACTRIM DS) 800-160 MG tablet    Sig: Take 1 tablet by mouth 2 (two) times daily for 10 days.    Dispense:  20 tablet    Refill:  0   Follow-up Information    [facility]   Contact information: [address] [place] [address] [phone]            Discharge Instructions     You may also try soaking your foot in [place] solution twice daily.     Reviewed expectations re: course of current medical issues. Questions answered. Outlined signs and symptoms indicating need for more acute intervention. Patient verbalized understanding. After Visit Summary given.  SUBJECTIVE: History from: patient. Drew Levine is a 33 y.o. male who reports persistent mild to moderate pain of his left great toe; around toenail; described as "soreness" without radiation. Onset: gradual, over the past several weeks. Injury/trama: tried self removal of partial left toenail; painful since. Symptoms have progressed to a point and plateaued since beginning. Aggravating factors: "touching my toe". Alleviating factors: Tylenol helps. Associated symptoms: none reported. Extremity sensation changes or weakness: none. Does reports thick yellow drainage from around toenail.  Past Surgical History:  Procedure Laterality Date  . LEG SURGERY       ROS: As per HPI.   OBJECTIVE:  Vitals:   06/29/18 1048  BP: 122/79  Pulse: 69  Resp: 18  Temp: 97.8 F (36.6 C)  SpO2: 98%    General appearance: alert; no distress Extremities: . LLE: warm and well perfused; poorly localized moderate tenderness over area of partially removed toenail; without gross deformities; with mild swelling; with no bruising; without bleeding; ROM: normal  CV: brisk extremity capillary refill of LLE; 2+ DP and PT pulse of LLE. Skin: warm and dry; no visible rashes Neurologic: gait normal; normal reflexes of RLE and LLE; normal sensation of RLE and LLE; normal strength of RLE and LLE Psychological: alert and cooperative; normal mood and affect  No Known Allergies   Social History   Socioeconomic History  . Marital status: Significant Other    Spouse name: Not on file  . Number of children: Not on file  . Years of education: Not on file  . Highest education level: Not on file  Occupational History  . Not on file  Social Needs  . Financial resource strain: Not on file  . Food insecurity:    Worry: Not on file    Inability: Not on file  . Transportation needs:    Medical: Not on file    Non-medical: Not on file  Tobacco Use  . Smoking status: Never Smoker  . Smokeless tobacco: Never Used  Substance and Sexual Activity  . Alcohol use: No  . Drug use: No  . Sexual activity: Never  Lifestyle  . Physical activity:    Days per week: Not on file    Minutes per session: Not on file  . Stress: Not on file  Relationships  . Social connections:    Talks on phone: Not on file    Gets together: Not on file    Attends religious service: Not on file    Active member of club or organization: Not on file    Attends meetings of clubs  or organizations: Not on file    Relationship status: Not on file  Other Topics Concern  . Not on file  Social History Narrative  . Not on file   Family History  Problem Relation Age of Onset  . Healthy Mother   . Healthy Father    Past Surgical History:  Procedure Laterality Date  . LEG SURGERY        Mardella Layman, MD 07/05/18 [phone]

## 2018-07-19 ENCOUNTER — Other Ambulatory Visit (INDEPENDENT_AMBULATORY_CARE_PROVIDER_SITE_OTHER): Payer: Self-pay | Admitting: Primary Care

## 2018-07-19 DIAGNOSIS — R12 Heartburn: Secondary | ICD-10-CM

## 2018-09-01 ENCOUNTER — Other Ambulatory Visit (INDEPENDENT_AMBULATORY_CARE_PROVIDER_SITE_OTHER): Payer: Self-pay | Admitting: Primary Care

## 2018-09-01 DIAGNOSIS — F524 Premature ejaculation: Secondary | ICD-10-CM

## 2018-09-01 MED ORDER — PAROXETINE HCL 20 MG PO TABS: 20.0000 mg | ORAL_TABLET | Freq: Every day | ORAL | 3 refills | Status: DC

## 2019-06-15 IMAGING — CT CT CHEST W/O CM
2 of 3 series · 15 of 36 positions shown, 18 images · non-contrast
Comparison: CT abdomen 03/13/2018

CLINICAL DATA: Enlarged lymph nodes seen on recent CT abdomen, for
further characterization

EXAM:
CT CHEST WITHOUT CONTRAST
TECHNIQUE: Multidetector CT imaging of the chest was performed following the
standard protocol without IV contrast.

[Series 3: chest w/o 2mm st · axial · non-contrast · 0.89mm/px · z∈[+1195,+1421]mm · 12 of 133 slices shown, 15 images]
[im 10/133  mediastinal]
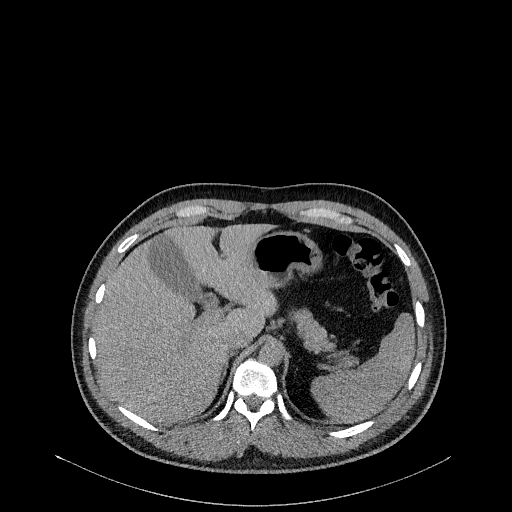
[im 10/133  lung]
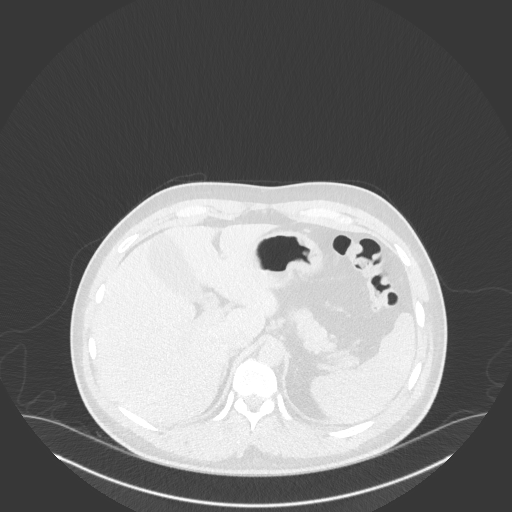
[im 20/133  lung]
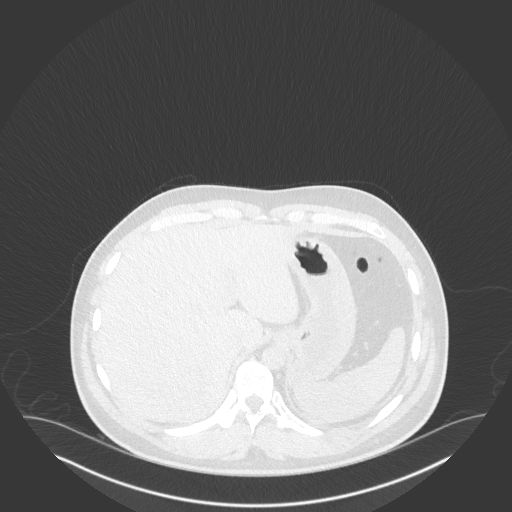
[im 30/133  lung]
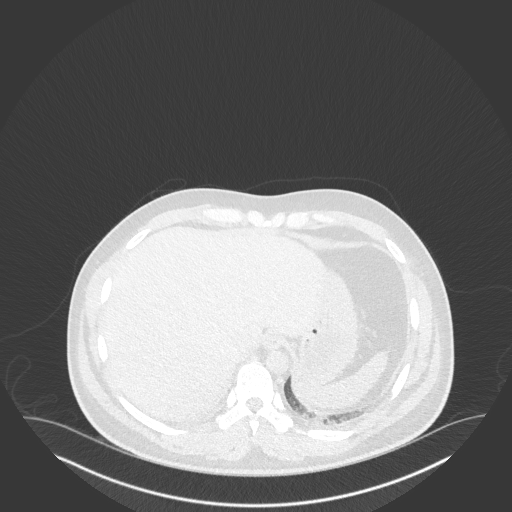
[im 40/133  lung]
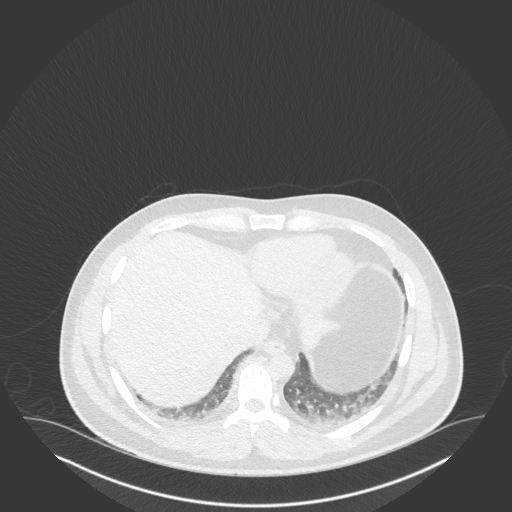
[im 49/133  mediastinal]
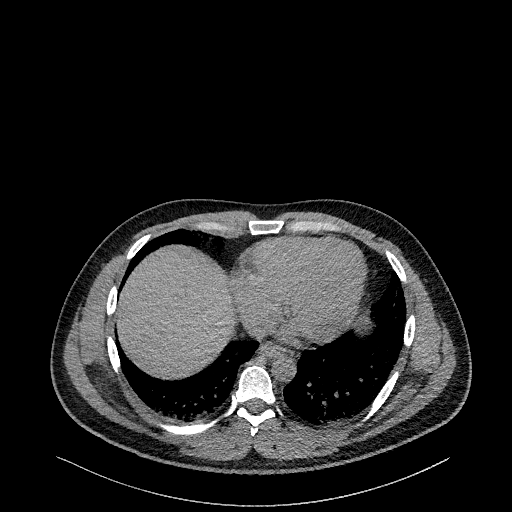
[im 49/133  lung]
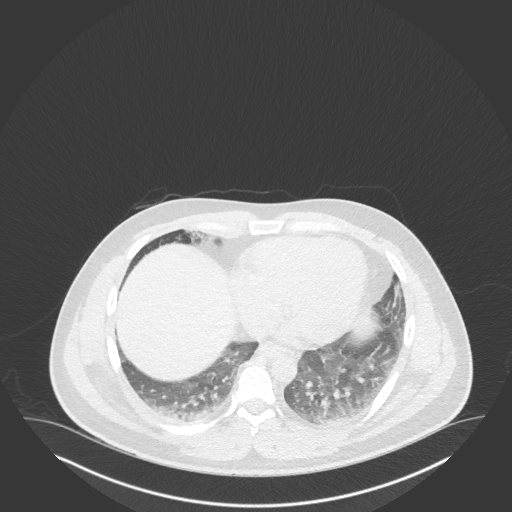
[im 59/133  lung]
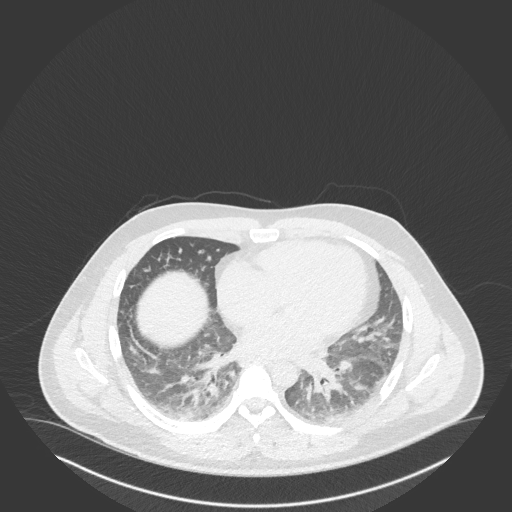
[im 74/133  lung]
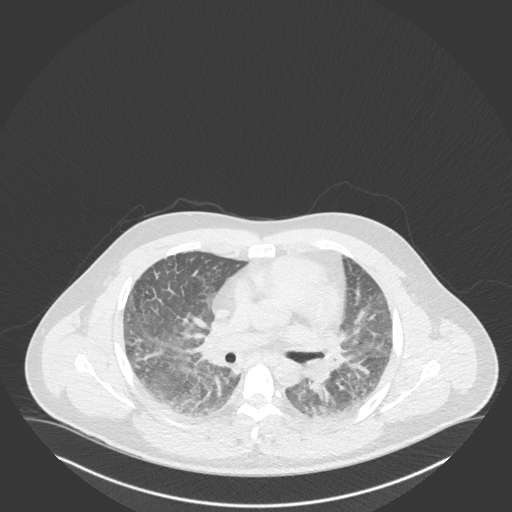
[im 84/133  lung]
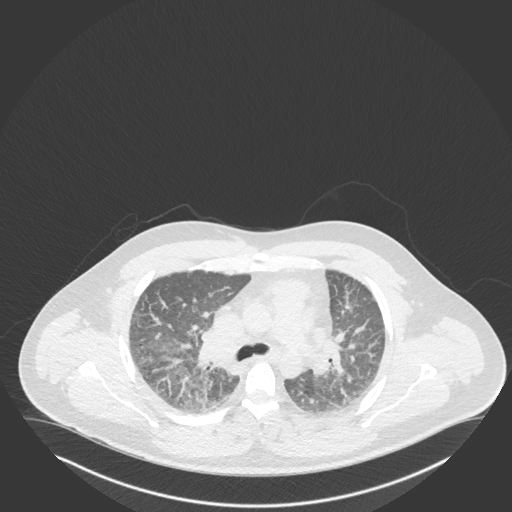
[im 93/133  mediastinal]
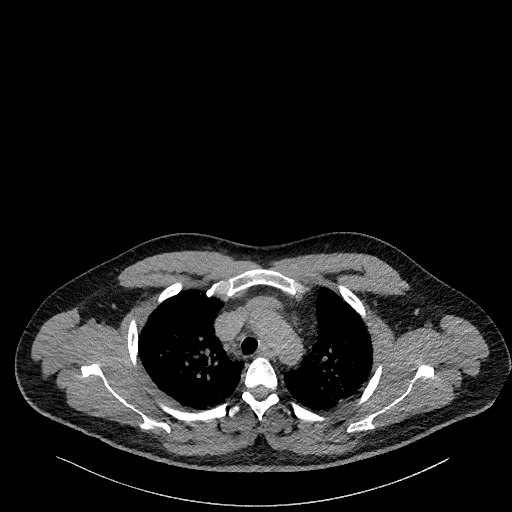
[im 93/133  lung]
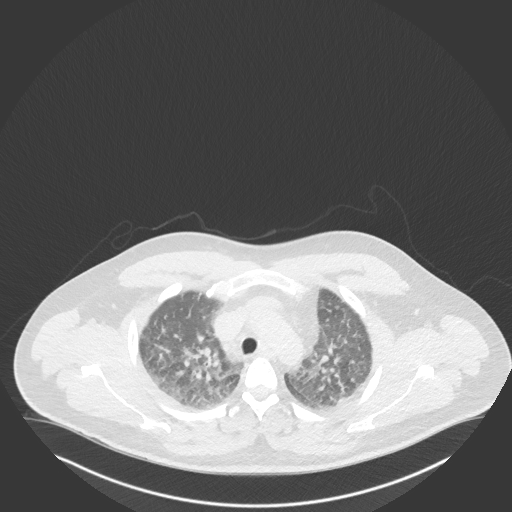
[im 103/133  lung]
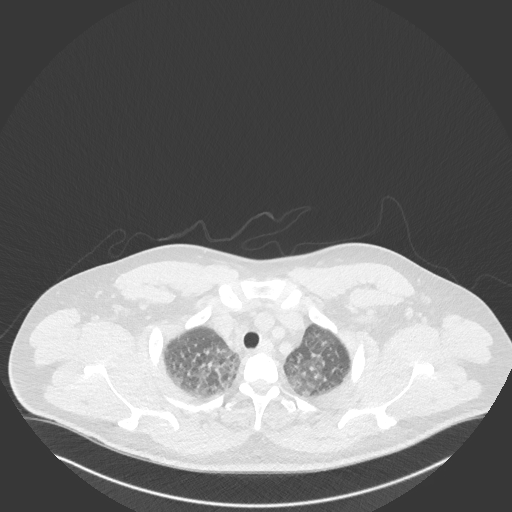
[im 113/133  lung]
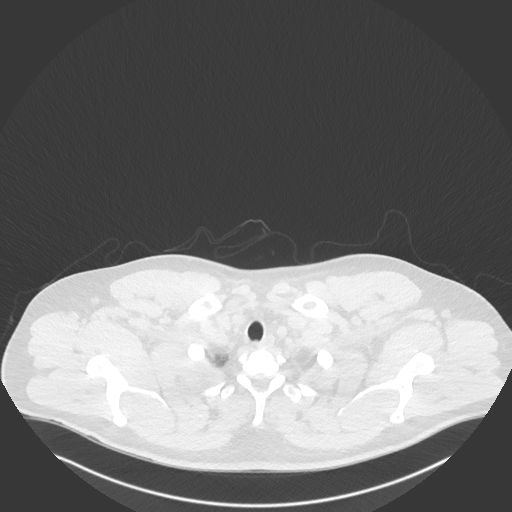
[im 123/133  lung]
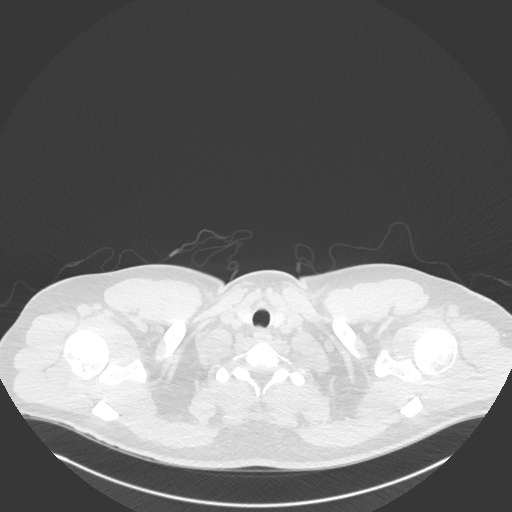

[Series 5: chest w/o 3mm st cor · coronal · non-contrast · 0.59mm/px · 3 of 101 slices shown]
[im 21/101  lung]
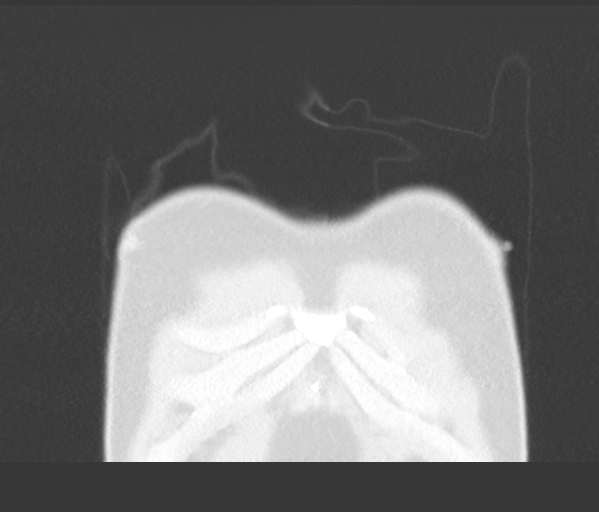
[im 41/101  lung]
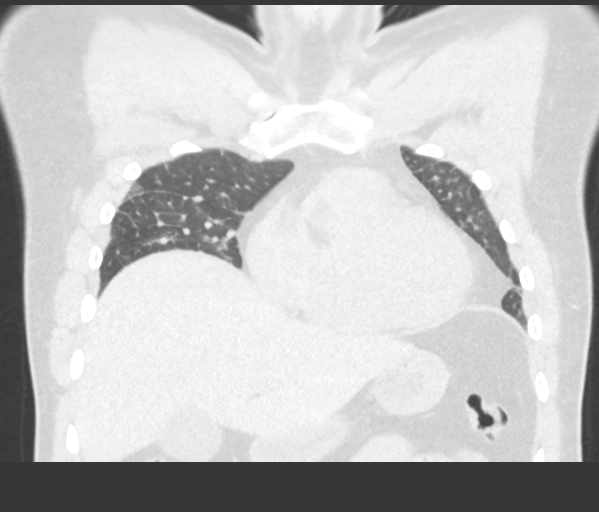
[im 61/101  lung]
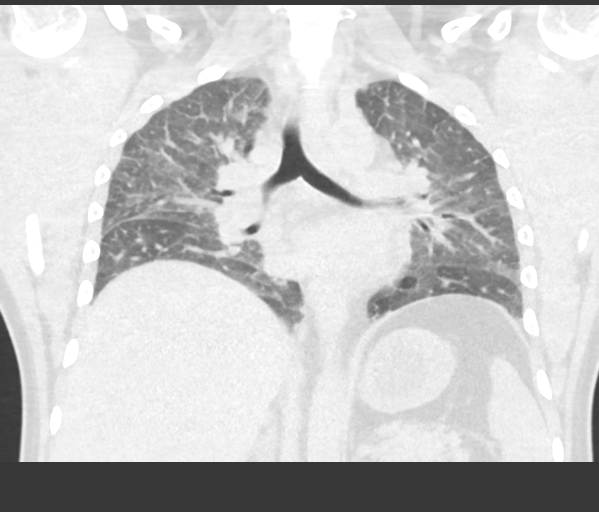

[15 of 36 positions shown; findings below may reference images not displayed]

FINDINGS: Cardiovascular: Mild cardiomegaly.

Mediastinum/Nodes: An AP window lymph node measures 1.2 cm in short
axis. Scattered small paratracheal lymph nodes are present. There is
suspicion for bilateral hilar adenopathy although the lymph nodes
are a similar density to surrounding vascular structures, with
indistinct fat planes along their margins. A left infrahilar lymph
node measures 1.3 cm in short axis on image 66/3. A right eccentric
subcarinal lymph node measures 1.6 cm in short axis on image 66/3.

Small bilateral axillary lymph nodes are present.

Lungs/Pleura: Mosaic attenuation with scattered ground-glass
densities in the lungs, likely infiltrative. Bilateral abnormal
airway thickening. Potential central peribronchovascular nodularity.
Reticulonodular opacities in the right middle lobe along with some
mild pleural-based nodularity. No significant pleural effusion.

Upper Abdomen: Please refer to detailed report from CT abdomen
03/13/2018

Musculoskeletal: Unremarkable
IMPRESSION: 1. Widespread hazy infiltrative ground-glass opacities in both lungs
with scattered reticulonodular opacity and airway thickening. The
appearance tends to favor atypical pneumonia over acute pulmonary
edema, despite the presence of the mild cardiomegaly.
2. Mild to moderate adenopathy in the chest. Some of this may have a
peribronchovascular component, raise the possibility of sarcoidosis,
although the paratracheal component of the adenopathy is less
striking than is typically encountered in sarcoidosis. Lymphoma can
cause a similar adenopathy in the chest, but would not typically be
associated with the pulmonary findings, which make the adenopathy
more likely to be reactive to an atypical infectious process. The
non-specific hypoenhancing lesions in the spleen are concerning, but
there is no splenomegaly to further suggest lymphoma. I would
suggest presumptive treatment for atypical infectious process with
follow up chest radiography and a low threshold for follow up CT
imaging to ensure resolution. Consider obtaining an angiotensin
converting enzyme (ACE) level as corroborated of information
regarding the possibility of sarcoidosis.
3. The mild cardiomegaly is abnormal for the patient's young age,
and might prompt echocardiography.

## 2020-02-05 ENCOUNTER — Ambulatory Visit (INDEPENDENT_AMBULATORY_CARE_PROVIDER_SITE_OTHER): Payer: Self-pay | Admitting: Primary Care

## 2020-02-05 ENCOUNTER — Other Ambulatory Visit: Payer: Self-pay

## 2020-02-05 ENCOUNTER — Encounter (INDEPENDENT_AMBULATORY_CARE_PROVIDER_SITE_OTHER): Payer: Self-pay | Admitting: Primary Care

## 2020-02-05 VITALS — BP 133/90 | HR 77 | Temp 97.3°F | Ht 66.0 in | Wt 180.2 lb

## 2020-02-05 DIAGNOSIS — R030 Elevated blood-pressure reading, without diagnosis of hypertension: Secondary | ICD-10-CM

## 2020-02-05 DIAGNOSIS — R519 Headache, unspecified: Secondary | ICD-10-CM

## 2020-02-05 DIAGNOSIS — F411 Generalized anxiety disorder: Secondary | ICD-10-CM

## 2020-02-05 DIAGNOSIS — Z0000 Encounter for general adult medical examination without abnormal findings: Secondary | ICD-10-CM

## 2020-02-05 DIAGNOSIS — G8929 Other chronic pain: Secondary | ICD-10-CM

## 2020-02-05 DIAGNOSIS — L659 Nonscarring hair loss, unspecified: Secondary | ICD-10-CM

## 2020-02-05 MED ORDER — PAROXETINE HCL 20 MG PO TABS: 20.0000 mg | ORAL_TABLET | Freq: Every day | ORAL | 1 refills | Status: DC

## 2020-02-05 NOTE — Patient Instructions (Addendum)
Prevencin de la hipertensin Preventing Hypertension La hipertensin, conocida comnmente como presin arterial alta, se produce cuando la sangre bombea en las arterias con mucha fuerza. Las arterias son vasos sanguneos que transportan la sangre desde el corazn al resto del cuerpo. Con el transcurso del [place], la hipertensin puede daar las arterias y Engineer, manufacturing systems flujo de sangre hacia partes importantes del cuerpo que incluyen el cerebro, el corazn y los riones. Con frecuencia, la hipertensin no causa sntomas hasta que la presin arterial es muy alta. Por este motivo, es importante que controle regularmente su presin arterial. La hipertensin se puede prevenir con frecuencia con cambios en la dieta y el estilo de vida. Si ya tiene hipertensin, puede controlarla con cambios en la dieta y el estilo de vida y con medicamentos. Qu cambios en la alimentacin se pueden hacer? Mantenga una dieta saludable. Esto incluye lo siguiente:  Menor ingesta de sal (sodio). Pregntele al mdico cunto sodio puede consumir de forma segura. La recomendacin general es consumir menos de 1cucharadita (2300mg ) de sodio por da. ? No agregue sal a las comidas. ? Opte por alimentos con bajo contenido de sodio cuando realice las compras o coma fuera de casa.  Limite la cantidad de grasa en la dieta. Esto se puede lograr con o de bajo contenido de grasas e ingiriendo menor cantidad de carnes rojas.  Coma ms frutas, verduras y cereales integrales. Establezca un objetivo para comer: ? 1 a 2tazas de frutas y verduras frescas todos los [employer]. ? 3 a 4porciones de cereales [address]  [address].  Evite los alimentos y las bebidas que tengan azcares agregados.  Coma pescados que contengan grasas saludables (cidos grasos omega-3), como la caballa o el salmn. Si necesita implementar un plan de comidas saludable, pruebe la dieta DASH. Esta dieta tiene un alto contenido de frutas,  verduras y [employer]. Incluye poca cantidad de sodio, carnes rojas y azcares agregados. DASH es la sigla en ingls de "Enfoques Alimentarios para Detener la Hipertensin". Qu cambios en el estilo de vida se pueden realizar?   Baje de peso si es necesario. Con tan solo bajar entre el 3% y el 5% del peso corporal puede prevenir o controlar la hipertensin. ? Por ejemplo, si su peso actual es de 200libras (91kg), una prdida entre el 3% y el 5% de su peso significa perder entre 6 y 10libras (2,7 a 4,5kg). ? Pdale al mdico que le recomiende una dieta y un plan de ejercicios para bajar de peso de forma segura.  Ejerctese lo suficiente. Debe realizar al menos 10-12-2002 de ejercicios de intensidad moderada todas las semanas. ? en sesiones cortas de ejercicios, varias veces al da, o puede realizar sesiones ms largas, pero menos veces por semana. Por ejemplo, puede realizar una caminata enrgica o andar en bicicleta durante Theatre stage manager, 3veces al da, durante 5das a la semana.  Encuentre maneras de reducir el estrs, como hacer ejercicios, , Primary school teacher o tomar una clase de yoga. Si necesita ayuda para reducir Optometrist de estrs, consulte al mdico.  No fume. Esto incluye los cigarrillos electrnicos. Las sustancias qumicas presentes en los productos con tabaco y nicotina elevan su presin arterial cada vez que fuma. Si necesita ayuda para dejar de fumar, consulte al mdico.  Evite el alcohol. Si bebe alcohol, limite el consumo a no ms de [employer] por da si es mujer y no est [place], y [place] por da si es hombre. equivale a 12onzas  de cerveza, 5onzas de vino o 1onzas de bebidas alcohlicas de alta graduacin. Por qu son importantes estos cambios? Los Allied Waste Industriescambios en la dieta y el estilo de vida pueden ayudar a prevenir la hipertensin y a Passenger transport managerhacerlo sentir mejor al mejorar su calidad de vida. Si tiene hipertensin, Copywriter, advertisinghacer  estos cambios le ayudarn a Theatre managercontrolarla y a Financial traderprevenir complicaciones ms importantes, como, por ejemplo:  El endurecimiento y Catering managerel estrechamiento de las arterias que proveen sangre a: ? Su corazn. Esto puede producirle un infarto de miocardio. ? Su cerebro. Esto puede ser la causa de un accidente cerebrovascular. ? Los riones. Esto puede causar insuficiencia renal.  Estrs en el msculo cardaco, lo que puede producir insuficiencia cardaca. Qu puedo hacer para reducir mis riesgos?   Trabaje junto al mdico para desarrollar un plan de prevencin de la hipertensin que funcione para usted. Siga su plan y concurra a todas las visitas de control como se lo haya indicado el mdico.  Aprenda a medir su presin arterial en casa. Asegrese de Solicitorconocer su objetivo de presin arterial, como se lo haya indicado el mdico. Cmo se trata? Adems de los cambios en la dieta y el estilo de vida, Oregonel mdico podr indicarle medicamentos para ayudarle a Publishing copybajar su presin arterial. Tal vez deba probar distintos medicamentos hasta encontrar el ms adecuado para usted. Quiz necesite tomar ms de uno. Tome los medicamentos de venta libre y los recetados solamente como se lo haya indicado el mdico. Dnde encontrar apoyo Su mdico puede ayudarle a prevenir la hipertensin y Pharmacologistmantener su presin arterial en un nivel saludable. Su hospital o comunidad locales tambin pueden proporcionarle servicios y programas de prevencin. La Asociacin Americana del Corazn (American Heart Association) ofrece un red de soporte en lnea en: https://www.lee.net/http://supportnetwork.heart.org/high-blood-pressure Dnde encontrar ms informacin Obtenga ms informacin sobre la hipertensin en:  Training and development officernstituto Nacional del Programmer, multimediaCorazn, del Pulmn y de Risk managerla [place] (National Heart, Lung, and Blood Institute): https://www.peterson.org/www.nhlbi.nih.gov/health/health-topics/topics/hbp  Centros para el control y Engineer, agriculturalla prevencin de Child psychotherapistenfermedades (Centers for Disease Control and Prevention, CDC):  AboutHD.co.nzwww.cdc.gov/bloodpressure  Market researcherAcademia Estadounidense de Mdicos de [place] crewamilia (American Academy of Family Physicians): http://familydoctor.org/familydoctor/en/diseases-conditions/high-blood-pressure.printerview.all.html  Transtorno de ansiedade generalizada, adultos Generalized Anxiety Disorder, Adult O transtorno de ansiedade generalizada (TAG)  um distrbio de sade mental. As pessoas que sofrem dessa condio ficam constantemente ansiosas com eventos do dia a dia. Diferentemente da ansiedade normal, a ansiedade relacionada ao TAG no  disparada por um evento especfico. Alm disso, essa ansiedade no diminui ou melhora com o tempo. O TAG interfere nas funes bsicas da vida, incluindo relacionamentos, trabalho e escola. O TAG pode variar de leve a grave. Pessoas com TAG grave podem ter episdios intensos de ansiedade com sintomas fsicos (ataques de pnico). Quais so as causas? A causa exata do TAG  desconhecida. O que aumenta o risco? Esse quadro clnico tem maior probabilidade de se desenvolver em:  Mulheres.  Pessoas com histrico familiar de transtornos de ansiedade.  Pessoas muito tmidas.  Pessoas que passam por eventos muito estressantes na vida, como a morte de Lucent Technologiespessoas amadas.  Pessoas que tm um First Data Corporationambiente familiar muito estressante. Quais so os sinais ou sintomas? Pessoas com TAG geralmente se preocupam em excesso com muitas coisas em suas vidas, como sua sade e sua famlia. Elas tambm podem ser excessivamente preocupadas com:  Serem boas no trabalho.  Serem pontuais.  Desastres naturais.  Amizades. Os sintomas fsicos do TAG incluem:  Fadiga.  Tenso muscular ou espasmos musculares.  Tremedeira ou sensao de fraqueza.  Tomar sustos com  facilidade.  Sentir seu corao bater forte ou acelerado.  Sentir falta de ar ou como se no conseguisse respirar fundo.  Dificuldade para pegar no sono ou manter o sono profundo.  Sudorese.  Enjoo, diarreia ou sndrome  do intestino irritvel.  Dores de cabea.  Dificuldade de [place] ou de [place].  Agitao.  Irritabilidade. Como esse quadro clnico  diagnosticado? Seu mdico poder diagnosticar o TAG com base nos seus sintomas e histrico mdico. Voc tambm passar por um exame fsico. O mdico far perguntas especficas sobre seus sintomas, incluindo qual a intensidade deles, quando comearam e se surgem e passam. Seu mdico poder perguntar sobre seu uso de lcool ou drogas, incluindo medicamentos vendidos com receita. Seu mdico poder encaminh-lo a um profissional de sade mental para avaliao e tratamento adicionais. O seu mdico far um exame minucioso e poder realizar testes adicionais para descartar outras causas possveis de seus sintomas. Para ser diagnosticado com TAG, uma pessoa precisa sofrer de uma ansiedade que:  Mali fora de controle dele ou dela.  Afete diversos aspectos diferentes de sua vida, como trabalho e relacionamentos.  Cause um nvel de angstia que o torne incapaz de participar de atividades normais.  Inclua pelo menos trs sintomas fsicos do TAG, como inquietao, fadiga, dificuldade de concentrao, irritabilidade, tenso muscular ou problemas para dormir. Antes que seu mdico possa confirmar um diagnstico de TAG, esses sintomas devem estar presentes na [place] e devem durar seis meses ou [place]. Como esse quadro clnico  tratado? As seguintes terapias so geralmente usadas para tratar o TAG:  Medicamento. Medicamentos antidepressivos em geral so prescritos para o controle dirio de Special educational needs teacher. Medicamentos para ansiedade podem ser [address] graves, especialmente quando ocorrem ataques de pnico.  Terapia pela fala (psicoterapia). Certos tipos de terapia pela fala podem ser teis no tratamento do TAG, ao proporcionarem apoio, informaes e orientao. As opes incluem: ? Terapia cognitivo-comportamental (TCC). As pessoas aprendem  habilidades e tcnicas para enfrentar e aliviar a ansiedade. Aprendem a identificar pensamentos e comportamentos irreais ou negativos e a substitu-los por conceitos positivos. ? Terapia de aceitao e compromisso (TAC). Esse tratamento ensina s pessoas como a conscincia pode ser uma forma de lidar com pensamentos e sentimentos indesejados. ? Biofeedback. Esse processo  um treinamento para que voc Therapist, art as Technical brewer de seu corpo (respostas fisiolgicas) por meio de tcnicas de respirao e mtodos de relaxamento. Voc trabalhar com um terapeuta enquanto mquinas so usadas para monitorar seus sintomas fsicos.  Tcnicas de controle do estresse. Incluem ioga, meditao e exerccio. Um especialista em sade mental poder determinar qual tratamento  melhor para voc. Algumas pessoas obtm melhoras com um nico tratamento. No entanto, outras pessoas necessitam de uma combinao de tratamentos. Siga estas instrues em casa:  Tome medicamentos vendidos com ou sem receita mdica somente de acordo com as indicaes do seu mdico.  Tente manter uma rotina normal.  Tente prever as situaes estressantes e dedique um tempo maior para contorn-las.  Pratique todas as tcnicas de controle do estresse ou autocalmantes conforme ensinado por seu mdico.  No culpe a si mesmo por recadas ou por Acupuncturist.  Tente reconhecer seus avanos, mesmo que sejam pequenos.  Comparea a todas as consultas de acompanhamento de acordo com as orientaes do seu mdico. Isso  importante. Entre em contato com um mdico se:  Seus sintomas no melhorarem.  Seus sintomas piorarem.  Voc tiver sinais de depresso, como: ? Um humor persistentemente triste, abatido ou irritvel. ? Perda de  prazer em atividades que sempre foram prazerosas. ? Alterao no peso ou no apetite. ? Alterao no sono. ? Evitar os amigos ou familiares. ? Falta de energia em tarefas normais. ? Sensao de culpa ou  falta de valor. Tomasa Hose ajuda imediatamente se:  Considerar seriamente a ideia de fazer mal a si mesmo ou a outras pessoas. Se sentir vontade de ferir a si mesmo ou a terceiros ou pensar em tirar a prpria vida, procure ajuda imediatamente. Voc pode ir para o pronto-socorro mais prximo ou ligar para:  O nmero de Technical brewer (911, nos [place]).  Um servio telefnico de preveno do suicdio, como o National Suicide Prevention Lifeline, no nmero [phone]. Ele funciona 24 horas por dia. Resumo  O transtorno da ansiedade generalizada (TAG)  um distrbio de sade mental que envolve um tipo de ansiedade que no  causado por um evento especfico.  Pessoas com TAG geralmente se preocupam em excesso com muitas coisas em [employer], como sua sade e sua famlia.  O TAG pode causar sintomas fsicos como inquietao, dificuldade de concentrao, problemas para dormir, transpirao frequente, enjoo, diarreia, dor de cabea e tremedeira ou espasmos musculares.  Um especialista em sade mental poder determinar qual tratamento  melhor para voc. Algumas pessoas obtm melhoras com um nico tratamento. No entanto, outras pessoas necessitam de uma combinao de tratamentos. Estas informaes no se destinam a substituir as recomendaes de seu mdico. No deixe de discutir quaisquer dvidas com seu mdico. Document Revised: 06/04/2016 Document Reviewed: 06/04/2016 Elsevier Patient Education  2020 Elsevier Inc. Obtenga ms informacin sobre la dieta DASH en:  Training and development officer del [place], del Pulmn y de Risk manager (National Heart, Lung, and Blood Institute): WedMap.it Comunquese con un mdico si:  Piensa que tiene Runner, broadcasting/film/video a los medicamentos que ha tomado.  Tiene mareos o dolores de cabeza con Naval architect.  Tiene hinchazn en los tobillos.  Tiene problemas de visin. Resumen  La hipertensin con frecuencia no provoca  sntomas hasta que la presin arterial es muy alta. Es importante que controle regularmente su presin arterial.  Los cambios en la dieta y el estilo de vida son los pasos ms importantes hacia la prevencin de la hipertensin.  Si mantiene su presin arterial en un nivel saludable, podr prevenir complicaciones como infarto de miocardio, insuficiencia cardaca, accidente cerebrovascular e insuficiencia renal.  Trabaje junto al mdico para desarrollar un plan de prevencin de la hipertensin que funcione para usted. Esta informacin no tiene Theme park manager el consejo del mdico. Asegrese de hacerle al mdico cualquier pregunta que tenga. Document Revised: 05/13/2016 Document Reviewed: 02/17/2015 Elsevier Patient Education  2020 [employer].

## 2020-02-05 NOTE — Progress Notes (Signed)
Pt complains of headache and stress and when it gets really bad he starts to tremble

## 2020-02-05 NOTE — Progress Notes (Signed)
Patient ID: Drew Levine, male   DOB: December 29, 1985, 34 y.o.   MRN: [id number]  Chief Complaint  Patient presents with   Annual Exam    HPI Drew Levine is a 34 y.o. 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  Patient presents today for annual physical and voices concerns with hair falling out, increased stress and anxiety.  This is causing him to feel his body shake and chest pain.  Previously he was taking Paxil daily for anxiety depression and insomnia.  Unclear why patient is no longer on medication other than last visit 09/01/2018 and no refills were given after July 2021. No past medical history on file.  Past Surgical History:  Procedure Laterality Date   LEG SURGERY      Family History  Problem Relation Age of Onset   Healthy Mother    Healthy Father     Social History Social History   Tobacco Use   Smoking status: Never Smoker   Smokeless tobacco: Never Used  Substance Use Topics   Alcohol use: No   Drug use: No    No Known Allergies  Current Outpatient Medications  Medication Sig Dispense Refill   omeprazole (PRILOSEC) 40 MG capsule TOME UNA CAPSULA TODOS LOS DIAS (Patient not taking: Reported on 02/05/2020) 30 capsule 2   PARoxetine (PAXIL) 20 MG tablet Take 1 tablet (20 mg total) by mouth daily. (Patient not taking: Reported on 02/05/2020) 30 tablet 3   No current facility-administered medications for this visit.    Review of Systems Review of Systems  Skin:       alopecia   Psychiatric/Behavioral: Positive for agitation. The patient is nervous/anxious.   All other systems reviewed and are negative.   Blood pressure 133/90, pulse 77, temperature (!) 97.3 F (36.3 C), temperature source Temporal, height 5\' 6"  (1.676 m), weight 180 lb 3.2 oz (81.7 kg), SpO2 96 %.  Physical Exam Physical Exam Vitals reviewed.  HENT:     Head: Normocephalic.     Right Ear: Tympanic membrane, ear canal and external ear normal.      Left Ear: Tympanic membrane, ear canal and external ear normal.     Nose: Nose normal.  Eyes:     Extraocular Movements: Extraocular movements intact.     Pupils: Pupils are equal, round, and reactive to light.  Cardiovascular:     Rate and Rhythm: Normal rate and regular rhythm.  Pulmonary:     Effort: Pulmonary effort is normal.     Breath sounds: Normal breath sounds.  Abdominal:     General: Bowel sounds are normal. There is distension.     Palpations: Abdomen is soft.  Musculoskeletal:        General: Normal range of motion.     Cervical back: Normal range of motion and neck supple.  Skin:    General: Skin is warm and dry.  Neurological:     Mental Status: He is alert and oriented to person, place, and time.  Psychiatric:        Mood and Affect: Mood normal.        Behavior: Behavior normal.        Thought Content: Thought content normal.        Judgment: Judgment normal.     Data Reviewed Yes  Erubiel was seen today for annual exam.  Diagnoses and all orders for this visit:  Routine general medical examination at a health care facility Physical completed   GAD (  generalized anxiety disorder) -     PARoxetine (PAXIL) 20 MG tablet; Take 1 tablet (20 mg total) by mouth daily.  Alopecia Due to overwhelm stress . 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  He does have 5 spots of hairless areas the size of a dime frontal, temporal and occipital area.  Elevated blood-pressure reading without diagnosis of hypertension Counseled on blood pressure goal of less than 130/80, low-sodium, DASH diet, medication compliance, 150 minutes of moderate intensity exercise per week.  Chronic nonintractable headache, unspecified headache type Avoid headache triggers. If certain foods or odors seem to have triggered your migraines in the past, avoid them. A headache diary might help you identify triggers.   Manage stress. Find healthy ways to cope with the stressors, such as delegating  tasks on your to-do list.   Practice relaxation techniques. Try deep breathing, yoga, massage and visualization.   Eat regularly. Eating regularly scheduled meals and maintaining a healthy diet might help prevent headaches. Also, drink plenty of fluids.   Follow a regular sleep schedule. Sleep deprivation might contribute to headaches May use OTC 500 mg (x's 2) tylenol Q4 hrs as needed or ibuprofen 200 mg (x's2) Q 6 hrs as needed   Grayce Sessions 02/05/2020, 3:15 PM

## 2020-04-08 ENCOUNTER — Ambulatory Visit (INDEPENDENT_AMBULATORY_CARE_PROVIDER_SITE_OTHER): Payer: Self-pay | Admitting: Primary Care

## 2020-04-08 ENCOUNTER — Encounter (INDEPENDENT_AMBULATORY_CARE_PROVIDER_SITE_OTHER): Payer: Self-pay | Admitting: Primary Care

## 2020-04-08 ENCOUNTER — Other Ambulatory Visit: Payer: Self-pay

## 2020-04-08 VITALS — BP 122/73 | HR 68 | Temp 97.5°F | Ht 66.0 in | Wt 178.8 lb

## 2020-04-08 DIAGNOSIS — L659 Nonscarring hair loss, unspecified: Secondary | ICD-10-CM

## 2020-04-08 DIAGNOSIS — F411 Generalized anxiety disorder: Secondary | ICD-10-CM

## 2020-04-08 MED ORDER — FLUOXETINE HCL 20 MG PO TABS: 20.0000 mg | ORAL_TABLET | Freq: Every day | ORAL | 3 refills | Status: DC

## 2020-04-08 NOTE — Patient Instructions (Signed)
Alopecia areata en los adultos Alopecia Areata, Adult  La alopecia areata es una afeccin que provoca la cada del pelo. A una persona con esta afeccin se le puede caer el pelo en zonas del cuero cabelludo. En algunos casos, la persona puede perder todo el pelo del cuero cabelludo o de la cara y el cuerpo. Tener esta afeccin puede ser difcil emocionalmente, pero no es peligrosa. La alopecia areata es una enfermedad autoinmunitaria. Esto significa que el sistema de defensa del organismo (sistema inmunitario) confunde las partes normales del cuerpo con grmenes u otras cosas que pueden causarle enfermedades. Cuando tiene alopecia areata, el sistema inmunitario ataca los folculos pilosos. Cules son las causas? Se desconoce la causa de esta afeccin. Qu incrementa el riesgo? Es ms probable que tenga esta afeccin si presenta:  Antecedentes familiares de alopecia.  Antecedentes familiares de otra enfermedad autoinmunitaria, incluidas diabetes tipo1 y [place] enfermedad autoinmunitaria tiroidea.  Eczema, asma y alergias.  Sndrome de Down. Cules son los signos o sntomas? El sntoma principal de esta afeccin son parches redondos calvos en el cuero cabelludo. Los parches pueden causar una leve picazn. Otros sntomas pueden incluir los siguientes:  Pelo oscuro y Education officer, community en los parches calvos que es ms ancho en las puntas (pelo en signo de Geophysicist/field seismologist).  Marcas, manchas blancas o lneas en las uas de los dedos de la mano o del pie.  Calvicie y prdida del vello corporal. Esto es poco frecuente. La alopecia areata suele manifestarse en la niez, pero puede ocurrir a Actuary. En algunas personas, el pelo vuelve a crecer y no se vuelve a caer. En otros nios, el pelo se puede caer y crecer en ciclos. La prdida de pelo puede durar varios aos. Cmo se diagnostica? Esta afeccin se diagnostica en funcin de los sntomas y los antecedentes familiares. El mdico tambin le revisar la  piel del cuero cabelludo, los dientes y las uas. El mdico puede derivarlo a un mdico especialista en enfermedades capilares o de la piel (dermatlogo). Tambin pueden [employer], que incluyen los siguientes:  Una prueba de traccin capilar.  Anlisis de sangre u otras pruebas de deteccin para Engineer, manufacturing la presencia de enfermedades autoinmunitarias, como trastornos de la tiroides o diabetes.  Biopsia de la piel para confirmar el diagnstico.  Un procedimiento para examinar la piel con un instrumento de aumento con luz (dermatoscopia). Cmo se trata? La alopecia areata no tiene [place]. Los [employer] del tratamiento son promover el nuevo crecimiento del pelo y prevenir la reaccin excesiva del sistema inmunitario. No hay un nico tratamiento para las todas las personas con alopecia areata. Depender del tipo de cada del pelo y de la gravedad. Trabaje con el mdico para encontrar el tratamiento ms adecuado para usted. El tratamiento puede incluir:  Hacer controles peridicos para asegurarse de que la afeccin no est empeorando. A veces, esto se conoce como conducta expectante.  Usar cremas o comprimidos con corticoesteroides durante 6 a 8semanas para detener la reaccin inmunitaria y para que el pelo vuelva a crecer ms rpido.  Usar otros medicamentos en la piel (medicamentos tpicos) para Psychologist, educational respuesta del sistema inmunitario y ayudar al ciclo del crecimiento del pelo.  Inyecciones de corticoesteroides.  Psicoterapia y asesoramiento psicolgico con un grupo de apoyo o un terapeuta si tiene dificultades para lidiar con la cada del pelo. Siga estas instrucciones en su casa: Medicamentos  Aplique las cremas tpicas solamente como se lo haya indicado el mdico.  Use los medicamentos de [place] y  los recetados solamente como se lo haya indicado el mdico. Instrucciones generales  Infrmese todo lo que pueda sobre su enfermedad.  Considere usar peluca o productos que  hagan que el pelo luzca con ms volumen o cubra los parches calvos si se siente a disgusto con su aspecto.  Busque psicoterapia o asesoramiento psicolgico si le cuesta lidiar con la cada del pelo. Pida al mdico que le recomiende un asesor o un grupo de apoyo.  Concurra a todas las visitas de [address] se lo haya indicado el mdico. Esto es importante. Dnde buscar ms informacin  National Alopecia Areata Foundation (Fundacin Nacional para la Alopecia Areata): naaf.org Comunquese con un mdico si:  La cada del pelo empeora, incluso con tratamiento.  Aparecen nuevos sntomas.  Tiene problemas emocionales. Solicite ayuda de inmediato si:  Tiene un empeoramiento repentino de la cada del pelo. Resumen  La alopecia areata es una afeccin autoinmunitaria que hace que el sistema de defensa del organismo (sistema inmunitario) ataque los folculos pilosos. Esto ocasiona la cada del pelo.  Tener esta afeccin puede ser difcil emocionalmente, pero no es peligrosa.  Entre los tratamientos se incluyen controles peridicos para asegurarse de que la afeccin no est empeorando, medicamentos e inyecciones con corticoesteroides. Esta informacin no tiene Theme park manager el consejo del mdico. Asegrese de hacerle al mdico cualquier pregunta que tenga. Document Revised: 05/19/2019 Document Reviewed: 05/19/2019 Elsevier Patient Education  2021 [employer].

## 2020-04-08 NOTE — Progress Notes (Signed)
Established Patient Office Visit  Subjective:  Patient ID: Drew Levine, male    DOB: 26-Sep-1985  Age: 35 y.o. MRN: [id number]  CC: No chief complaint on file.   HPI [name] is a 35 year old Hispanic male who speaks Spanish  [place] [phone])  presents for follow on on anxiety and depression. Asked if medication help, he said  a little yes,  how did it help by being a little more at ease and helps sleep at night. Asked was the dose sufficient or did he feel medication needed to be increase - Patient stated he think it would be better if increased. Voiced having headaches infrequent now. Concern with hair falling out.  No past medical history on file.  Past Surgical History:  Procedure Laterality Date  . LEG SURGERY      Family History  Problem Relation Age of Onset  . Healthy Mother   . Healthy Father     Social History   Socioeconomic History  . Marital status: Significant Other    Spouse name: Not on file  . Number of children: Not on file  . Years of education: Not on file  . Highest education level: Not on file  Occupational History  . Not on file  Tobacco Use  . Smoking status: Never Smoker  . Smokeless tobacco: Never Used  Substance and Sexual Activity  . Alcohol use: No  . Drug use: No  . Sexual activity: Never  Other Topics Concern  . Not on file  Social History Narrative  . Not on file   Social Determinants of Health   Financial Resource Strain: Not on file  Food Insecurity: Not on file  Transportation Needs: Not on file  Physical Activity: Not on file  Stress: Not on file  Social Connections: Not on file  Intimate Partner Violence: Not on file    Outpatient Medications Prior to Visit  Medication Sig Dispense Refill  . PARoxetine (PAXIL) 20 MG tablet Take 1 tablet (20 mg total) by mouth daily. 90 tablet 1  . omeprazole (PRILOSEC) 40 MG capsule TOME UNA CAPSULA TODOS LOS DIAS (Patient not taking: No sig reported) 30 capsule  2   No facility-administered medications prior to visit.    No Known Allergies  ROS Review of Systems Pertinent positive and negative note in HPI   Objective:    Physical Exam Vitals reviewed.  HENT:     Head: Normocephalic.     Right Ear: External ear normal.     Left Ear: External ear normal.     Nose: Nose normal.  Eyes:     Extraocular Movements: Extraocular movements intact.  Cardiovascular:     Rate and Rhythm: Normal rate and regular rhythm.  Pulmonary:     Effort: Pulmonary effort is normal.     Breath sounds: Normal breath sounds.  Abdominal:     General: Bowel sounds are normal.     Palpations: Abdomen is soft.  Musculoskeletal:        General: Normal range of motion.     Cervical back: Normal range of motion.  Skin:    General: Skin is warm and dry.  Neurological:     Mental Status: He is alert and oriented to person, place, and time.  Psychiatric:        Mood and Affect: Mood normal.        Behavior: Behavior normal.        Thought Content: Thought content  normal.        Judgment: Judgment normal.     BP 122/73 (BP Location: Right Arm, Patient Position: Sitting, Cuff Size: Normal)   Pulse 68   Temp (!) 97.5 F (36.4 C) (Temporal)   Ht 5\' 6"  (1.676 m)   Wt 178 lb 12.8 oz (81.1 kg)   SpO2 95%   BMI 28.86 kg/m  Wt Readings from Last 3 Encounters:  04/08/20 178 lb 12.8 oz (81.1 kg)  02/05/20 180 lb 3.2 oz (81.7 kg)  04/06/18 179 lb (81.2 kg)     Health Maintenance Due  Topic Date Due  . COVID-19 Vaccine (1) Never done    There are no preventive care reminders to display for this patient.  No results found for: TSH Lab Results  Component Value Date   WBC 8.6 03/13/2018   HGB 17.0 03/13/2018   HCT 50.7 03/13/2018   MCV 88.5 03/13/2018   PLT 297 03/13/2018   Lab Results  Component Value Date   NA 135 03/13/2018   K 3.8 03/13/2018   CO2 22 03/13/2018   GLUCOSE 117 (H) 03/13/2018   BUN 14 03/13/2018   CREATININE 0.59 (L)  03/13/2018   BILITOT 0.9 03/13/2018   ALKPHOS 59 03/13/2018   AST 23 03/13/2018   ALT 27 03/13/2018   PROT 7.2 03/13/2018   ALBUMIN 4.1 03/13/2018   CALCIUM 9.0 03/13/2018   ANIONGAP 10 03/13/2018   No results found for: CHOL No results found for: HDL No results found for: LDLCALC No results found for: TRIG No results found for: CHOLHDL No results found for: 03/15/2018    Assessment & Plan:   Diagnoses and all orders for this visit:  Alopecia Information on AVS provided for alopecia Also can be due to stress   GAD (generalized anxiety disorder) Medication is working but not as well as it first did when he started taking discussed the increase in dosage. Patient felt like that would be appropriate. To help manage his anxiety and stress more effectively. -     FLUoxetine (PROZAC) 20 MG tablet; Take 1 tablet (20 mg total) by mouth daily.   Follow-up: Return in about 6 weeks (around 05/20/2020) for Medication f/u in person .    07/20/2020, NP

## 2020-05-21 ENCOUNTER — Ambulatory Visit (INDEPENDENT_AMBULATORY_CARE_PROVIDER_SITE_OTHER): Payer: Self-pay | Admitting: Primary Care

## 2020-08-28 ENCOUNTER — Other Ambulatory Visit (INDEPENDENT_AMBULATORY_CARE_PROVIDER_SITE_OTHER): Payer: Self-pay | Admitting: Primary Care

## 2020-08-28 DIAGNOSIS — F411 Generalized anxiety disorder: Secondary | ICD-10-CM

## 2021-01-27 ENCOUNTER — Other Ambulatory Visit (INDEPENDENT_AMBULATORY_CARE_PROVIDER_SITE_OTHER): Payer: Self-pay | Admitting: Primary Care

## 2021-01-27 DIAGNOSIS — F411 Generalized anxiety disorder: Secondary | ICD-10-CM

## 2021-01-27 NOTE — Telephone Encounter (Signed)
Sent to PCP to refill  

## 2021-01-28 ENCOUNTER — Other Ambulatory Visit (INDEPENDENT_AMBULATORY_CARE_PROVIDER_SITE_OTHER): Payer: Self-pay | Admitting: Primary Care

## 2021-05-27 ENCOUNTER — Ambulatory Visit (INDEPENDENT_AMBULATORY_CARE_PROVIDER_SITE_OTHER): Payer: Self-pay | Admitting: Primary Care

## 2021-07-01 ENCOUNTER — Encounter (INDEPENDENT_AMBULATORY_CARE_PROVIDER_SITE_OTHER): Payer: Self-pay | Admitting: Primary Care

## 2021-07-01 ENCOUNTER — Ambulatory Visit (INDEPENDENT_AMBULATORY_CARE_PROVIDER_SITE_OTHER): Payer: Self-pay | Admitting: Primary Care

## 2021-07-01 ENCOUNTER — Other Ambulatory Visit (INDEPENDENT_AMBULATORY_CARE_PROVIDER_SITE_OTHER): Payer: Self-pay | Admitting: Primary Care

## 2021-07-01 VITALS — BP 112/76 | HR 76 | Temp 98.2°F | Ht 66.0 in | Wt 179.8 lb

## 2021-07-01 DIAGNOSIS — L740 Miliaria rubra: Secondary | ICD-10-CM

## 2021-07-01 DIAGNOSIS — F411 Generalized anxiety disorder: Secondary | ICD-10-CM

## 2021-07-01 DIAGNOSIS — G4733 Obstructive sleep apnea (adult) (pediatric): Secondary | ICD-10-CM

## 2021-07-01 MED ORDER — FLUOXETINE HCL 20 MG PO TABS: ORAL_TABLET | ORAL | 3 refills | Status: DC

## 2021-07-01 NOTE — Patient Instructions (Signed)
El sarpullido por calor ocurre cuando el sudor queda atrapado en la piel. Los s?ntomas pueden variar desde peque?as ampollas hasta bultos profundos e inflamados. Algunas formas de sarpullido por calor causan mucha picaz?n. ?

## 2021-07-01 NOTE — Progress Notes (Addendum)
?Drew ? ?Saharsh Levine, is a 36 y.o. male ? ?[id number] ? ?[id number] ? ?DOB - 09-21-1985 ? ?Chief Complaint  ?Patient presents with  ? Rash  ? Snoring  ?    ? ?Subjective:  ? ?[name] is a 36 y.o. 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 Also, rash  upper body and left leg.  Denies changing soaps , detergents, deodorant, or cologne. He does note he has changed jobs and it is hot and sweating a lot. Discussed heat rash and yeast rash  Patient has No headache, No chest pain, No abdominal pain - No Nausea, No new weakness tingling or numbness, No Cough - shortness of breath ? ?No problems updated. ? ?No Known Allergies ? ?No past medical history on file. ? ?Current Outpatient Medications on File Prior to Visit  ?Medication Sig Dispense Refill  ? FLUoxetine (PROZAC) 20 MG tablet TOME UNA TABLETA TODOS LOS DIAS (Patient not taking: Reported on 07/01/2021) 90 tablet 3  ? omeprazole (PRILOSEC) 40 MG capsule TOME UNA CAPSULA TODOS LOS DIAS (Patient not taking: Reported on 02/05/2020) 30 capsule 2  ? ?No current facility-administered medications on file prior to visit.  ? ? ?Objective:  ? ?Vitals:  ? 07/01/21 1539  ?BP: 112/76  ?Pulse: 76  ?Temp: 98.2 ?F (36.8 ?C)  ?TempSrc: Oral  ?SpO2: 94%  ?Weight: 179 lb 12.8 oz (81.6 kg)  ?Height: 5\' 6"  (1.676 m)  ? ? ?Exam ?General appearance : Awake, alert, not in any distress. Speech Clear. Not toxic looking ?HEENT: Atraumatic and Normocephalic, pupils equally reactive to light and accomodation ?Neck: Supple, no JVD. No cervical lymphadenopathy.  ?Chest: Good air entry bilaterally, no added sounds  ?CVS: S1 S2 regular, no murmurs.  ?Abdomen: Bowel sounds present, Non tender and not distended with no gaurding, rigidity or rebound. ?Extremities: B/L Lower Ext shows no edema, both legs are warm to touch ?Neurology: Awake alert, and oriented X 3, CN II-XII intact, Non  focal ?Skin: No Rash ? ?Data Review ?No results found for: HGBA1C ? ?Assessment & Plan  ?Drew Levine was seen today for rash and snoring. ? ?Diagnoses and all orders for this visit: ? ?Heat rash ?El sarpullido por calor ocurre cuando el sudor queda atrapado en la piel. Los s?ntomas pueden variar desde peque?as ampollas hasta bultos profundos e inflamados. Algunas formas de sarpullido por calor causan mucha picaz?n. ? ?OSA (obstructive sleep apnea) ?Patient presents with possible obstructive sleep apnea. Patent has a 3 months history of symptoms of none. Patient generally gets 7 or 8 hours of sleep per night, and states they generally have generally restful sleep. Snoring of moderate severity is present. Apneic episodes is not present. Nasal obstruction is not present.  Patient does not have had tonsillectomy.  ?Per- evaluation recommended weight loss if no improvement a referral has been placed ? ? GAD (generalized anxiety disorder) ?Asked did he need to continue Prozac asked does it make his anxiety and depression better yes. Then Yes ?Refills sent ?There are no diagnoses linked to this encounter. ? ? ?Patient have been counseled extensively about nutrition and exercise. Other issues discussed during this visit include: low cholesterol diet, weight control and daily exercise, foot care, annual eye examinations at Ophthalmology, importance of adherence with medications and regular follow-up. We also discussed long term complications of uncontrolled diabetes and hypertension.  ? ?No follow-ups on file. ? ?The patient was given clear instructions to go to  ER or return to medical center if symptoms don't improve, worsen or new problems develop. The patient verbalized understanding. The patient was told to call to get lab results if they haven't heard anything in the next week.  ? ?This note has been created with Surveyor, quantity. Any transcriptional errors are unintentional.   ? ?Kerin Perna, NP ?07/01/2021, 4:07 PM  ?

## 2021-07-22 ENCOUNTER — Other Ambulatory Visit (INDEPENDENT_AMBULATORY_CARE_PROVIDER_SITE_OTHER): Payer: Self-pay | Admitting: Primary Care

## 2021-07-22 DIAGNOSIS — R12 Heartburn: Secondary | ICD-10-CM

## 2021-07-22 DIAGNOSIS — F411 Generalized anxiety disorder: Secondary | ICD-10-CM

## 2021-07-22 NOTE — Telephone Encounter (Unsigned)
Copied from CRM [id number]. Topic: General - Other >> Jul 22, 2021  4:01 PM Gaetana Michaelis A wrote: Reason for CRM: Medication Refill - Medication: FLUoxetine (PROZAC) 20 MG tablet [[id number]]   omeprazole (PRILOSEC) 40 MG capsule [[id number]]   Has the patient contacted their pharmacy? Yes.  The patient has been directed to contact their PCP (Agent: If no, request that the patient contact the pharmacy for the refill. If patient does not wish to contact the pharmacy document the reason why and proceed with request.) (Agent: If yes, when and what did the pharmacy advise?)  Preferred Pharmacy (with phone number or street name): [id number] - [place], [place] - [address] AT [facility] [address] [place] [address] Phone: [phone] Fax: [phone] Hours: Open 24 hours   Has the patient been seen for an appointment in the last year OR does the patient have an upcoming appointment? Yes.    Agent: Please be advised that RX refills may take up to 3 business days. We ask that you follow-up with your pharmacy.

## 2021-07-23 NOTE — Telephone Encounter (Signed)
Requested medication (s) are due for refill today:   Yes for Prozac, No for Prilosec  Requested medication (s) are on the active medication list:   Yes for Prozac, Prilosec is from 2020  Future visit scheduled:   Yes in 5 months   Last ordered: Prilosec 07/19/2018 #30, 2 refills;  Prozac 07/01/2021 #90, 3 refills [facility].  Not able to understand some of the rx due to being in [place].     Requested Prescriptions  Pending Prescriptions Disp Refills   FLUoxetine (PROZAC) 20 MG tablet 90 tablet 3    Sig: TOME UNA TABLETA TODOS LOS DIAS     Psychiatry:  Antidepressants - SSRI Passed - 07/22/2021  4:24 PM      Passed - Valid encounter within last 6 months    Recent Outpatient Visits           3 weeks ago Heat rash   [facility] Grayce Sessions, NP   1 year ago Alopecia   [facility] Grayce Sessions, NP   1 year ago Routine general medical examination at a health care facility   [facility] Grayce Sessions, NP   3 years ago 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 [facility])   [facility] Grayce Sessions, NP   3 years ago Premature ejaculation   [facility] Loletta Specter, PA-C       Future Appointments             In 5 months Grayce Sessions, NP [facility]              omeprazole (PRILOSEC) 40 MG capsule 30 capsule 2     Gastroenterology: Proton Pump Inhibitors Passed - 07/22/2021  4:24 PM      Passed - Valid encounter within last 12 months    Recent Outpatient Visits           3 weeks ago Heat rash   [facility] Grayce Sessions, NP   1 year ago Alopecia   [facility] Grayce Sessions, NP   1 year ago Routine general medical examination at a health care facility   [facility] Grayce Sessions, NP   3  years ago 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

## 2022-01-01 ENCOUNTER — Ambulatory Visit (INDEPENDENT_AMBULATORY_CARE_PROVIDER_SITE_OTHER): Payer: Self-pay | Admitting: Primary Care

## 2022-04-30 ENCOUNTER — Ambulatory Visit (INDEPENDENT_AMBULATORY_CARE_PROVIDER_SITE_OTHER): Payer: Self-pay | Admitting: *Deleted

## 2022-04-30 NOTE — Telephone Encounter (Signed)
Interpreter ID [id number] Chief Complaint: dizziness, abdominal pain  Symptoms: wakes up in am feels dizzy and abdominal . pain . Feels like BP dropping. Trembles at times and starts belching and feels normal again. Gets tired easily weakness reported. Pain in "private parts " at times with abdominal pain burning with urination itching  as well. Abdominal pain comes and goes.  Frequency: 2 month  Pertinent Negatives: Patient denies chest pain no difficulty breathing no fever, no N/V. No blood in stools  Disposition: '[]'$ ED /'[x]'$ Urgent Care (no appt availability in office) / '[]'$ Appointment(In office/virtual)/ '[]'$  [facility]/ '[]'$ Home Care/ '[]'$ Refused Recommended Disposition /'[]'$ [facility]/ '[]'$  Follow-up with PCP Additional Notes:   No available appt until April 1. Recommended UC or mobile bus. Patient reports he just wants appt. Please advise.    Reason for Disposition  [1] MILD pain (e.g., does not interfere with normal activities) AND [2] pain comes and goes (cramps) [3] present > 48 hours  (Exception: This same abdominal pain is a chronic symptom recurrent or ongoing AND present > 4 weeks.)  Answer Assessment - Initial Assessment Questions 1. LOCATION: "Where does it hurt?"      Upper abdominal pain  2. RADIATION: "Does the pain shoot anywhere else?" (e.g., chest, back)     Up to chest stomach feels like swelling up  3. ONSET: "When did the pain begin?" (Minutes, hours or days ago)      2 months ago  4. SUDDEN: "Gradual or sudden onset?"     Na  5. PATTERN "Does the pain come and go, or is it constant?"    - If it comes and goes: "How long does it last?" "Do you have pain now?"     (Note: Comes and goes means the pain is intermittent. It goes away completely between bouts.)    - If constant: "Is it getting better, staying the same, or getting worse?"      (Note: Constant means the pain never goes away completely; most serious pain is constant and gets worse.)      Comes and  goes  6. SEVERITY: "How bad is the pain?"  (e.g., Scale 1-10; mild, moderate, or severe)    - MILD (1-3): Doesn't interfere with normal activities, abdomen soft and not tender to touch.     - MODERATE (4-7): Interferes with normal activities or awakens from sleep, abdomen tender to touch.     - SEVERE (8-10): Excruciating pain, doubled over, unable to do any normal activities.       Comes and goes  7. RECURRENT SYMPTOM: "Have you ever had this type of stomach pain before?" If Yes, ask: "When was the last time?" and "What happened that time?"      na 8. CAUSE: "What do you think is causing the stomach pain?"     Not sure  9. RELIEVING/AGGRAVATING FACTORS: "What makes it better or worse?" (e.g., antacids, bending or twisting motion, bowel movement)     Medication from [place] taken to help with stomach upset  10. OTHER SYMPTOMS: "Do you have any other symptoms?" (e.g., back pain, diarrhea, fever, urination pain, vomiting)       Pain in "private parts" upper abdomen pain . Reports burning with urination at  times  Answer Assessment - Initial Assessment Questions 1. DESCRIPTION: "Describe your dizziness."     Starts trembling even after eating , dizziness throughout the day  2. LIGHTHEADED: "Do you feel lightheaded?" (e.g., somewhat faint, woozy, weak upon standing)     *  No Answer* 3. VERTIGO: "Do you feel like either you or the room is spinning or tilting?" (i.e. vertigo)     na 4. SEVERITY: "How bad is it?"  "Do you feel like you are going to faint?" "Can you stand and walk?"   - MILD: Feels slightly dizzy, but walking normally.   - MODERATE: Feels unsteady when walking, but not falling; interferes with normal activities (e.g., school, work).   - SEVERE: Unable to walk without falling, or requires assistance to walk without falling; feels like passing out now.      Dizziness when waking and happens at times throughout the day  5. ONSET:  "When did the dizziness begin?"     2 months ago  6.  AGGRAVATING FACTORS: "Does anything make it worse?" (e.g., standing, change in head position)     Na 7. HEART RATE: "Can you tell me your heart rate?" "How many beats in 15 seconds?"  (Note: not all patients can do this)       na 8. CAUSE: "What do you think is causing the dizziness?"     Not sure  9. RECURRENT SYMPTOM: "Have you had dizziness before?" If Yes, ask: "When was the last time?" "What happened that time?"     Na  10. OTHER SYMPTOMS: "Do you have any other symptoms?" (e.g., fever, chest pain, vomiting, diarrhea, bleeding)      Upper  Abdominal pain  no N/V feels weak and tired 11. PREGNANCY: "Is there any chance you are pregnant?" "When was your last menstrual period?"       na  Protocols used: Dizziness - Lightheadedness-A-AH, Abdominal Pain - Male-A-AH

## 2022-05-01 NOTE — Telephone Encounter (Signed)
Please contact pt and schedule an appt for next week if Drew Levine has a 910 double book open or any spot open. If pt symptoms gets worse he will need to be seen in the ED or urgent Care.

## 2022-05-07 ENCOUNTER — Telehealth: Payer: Self-pay | Admitting: Primary Care

## 2022-05-07 NOTE — Telephone Encounter (Signed)
Copied from [place]. Topic: Appointment Scheduling - Scheduling Inquiry for Clinic >> May 07, 2022  1:07 PM Sabas Sous wrote: Reason for CRM: Pt called following up on scheduling request from 05/01/2022. Please advise

## 2022-05-08 NOTE — Telephone Encounter (Signed)
Please contact pt and schedule an appt  

## 2022-05-26 ENCOUNTER — Encounter (INDEPENDENT_AMBULATORY_CARE_PROVIDER_SITE_OTHER): Payer: Self-pay | Admitting: Primary Care

## 2022-05-26 ENCOUNTER — Other Ambulatory Visit: Payer: Self-pay

## 2022-05-26 ENCOUNTER — Ambulatory Visit (INDEPENDENT_AMBULATORY_CARE_PROVIDER_SITE_OTHER): Payer: Self-pay | Admitting: Primary Care

## 2022-05-26 VITALS — BP 126/81 | HR 73 | Ht 66.0 in | Wt 178.8 lb

## 2022-05-26 DIAGNOSIS — E663 Overweight: Secondary | ICD-10-CM

## 2022-05-26 DIAGNOSIS — Z1159 Encounter for screening for other viral diseases: Secondary | ICD-10-CM

## 2022-05-26 DIAGNOSIS — R300 Dysuria: Secondary | ICD-10-CM

## 2022-05-26 DIAGNOSIS — Z0000 Encounter for general adult medical examination without abnormal findings: Secondary | ICD-10-CM

## 2022-05-26 DIAGNOSIS — L659 Nonscarring hair loss, unspecified: Secondary | ICD-10-CM

## 2022-05-26 DIAGNOSIS — R5383 Other fatigue: Secondary | ICD-10-CM

## 2022-05-26 NOTE — Progress Notes (Signed)
[facility]      [name] is a 37 year old Hispanic male.  Interpreter Drew Levine [phone].  Patient presents to clinic today for annual exam.  Patient is fasting for labs. Patient has No headache, No chest pain, No abdominal pain - No Nausea, No new weakness tingling or numbness, No Cough - shortness of breath   Acute Concerns: Dysuria,  hair falling , fatigue   weight gain, dry hair/skin, constipation, Chronic Issues: None  Health Maintenance: Immunizations -- UTD HIV/Hep C Screening --   No past medical history on file.  Past Surgical History:  Procedure Laterality Date   LEG SURGERY      Current Outpatient Medications on File Prior to Visit  Medication Sig Dispense Refill   FLUoxetine (PROZAC) 20 MG tablet TOME UNA TABLETA TODOS LOS DIAS (Patient not taking: Reported on 05/26/2022) 90 tablet 3   omeprazole (PRILOSEC) 40 MG capsule TOME UNA CAPSULA TODOS LOS DIAS (Patient not taking: Reported on 02/05/2020) 30 capsule 2   No current facility-administered medications on file prior to visit.    No Known Allergies  Family History  Problem Relation Age of Onset   Healthy Mother    Healthy Father     Social History   Socioeconomic History   Marital status: Significant Other    Spouse name: Not on file   Number of children: Not on file   Years of education: Not on file   Highest education level: Not on file  Occupational History   Not on file  Tobacco Use   Smoking status: Never   Smokeless tobacco: Never  Substance and Sexual Activity   Alcohol use: No   Drug use: No   Sexual activity: Never  Other Topics Concern   Not on file  Social History Narrative   Not on file   Social Determinants of Health   Financial Resource Strain: Not on file  Food Insecurity: Not on file  Transportation Needs: Not on file  Physical Activity: Not on file  Stress: Not on file  Social Connections: Not on file  Intimate Partner Violence: Not on  file    ROS Comprehensive ROS Pertinent positive and negative noted in HPI   Blood Pressure 126/81   Pulse 73   Height 5\' 6"  (1.676 m)   Weight 178 lb 12.8 oz (81.1 kg)   Oxygen Saturation 96%   Body Mass Index 28.86 kg/m   Physical Exam Vitals reviewed.  Constitutional:      Comments: Overweight   HENT:     Head: Normocephalic.     Right Ear: Tympanic membrane and external ear normal.     Left Ear: External ear normal.     Nose: Nose normal.  Eyes:     Extraocular Movements: Extraocular movements intact.     Pupils: Pupils are equal, round, and reactive to light.  Cardiovascular:     Rate and Rhythm: Normal rate and regular rhythm.  Pulmonary:     Effort: Pulmonary effort is normal.     Breath sounds: Normal breath sounds.  Abdominal:     General: Bowel sounds are normal. There is distension.  Musculoskeletal:        General: Normal range of motion.     Cervical back: Normal range of motion and neck supple.  Skin:    General: Skin is warm and dry.  Neurological:     Mental Status: He is alert and oriented to person, place, and time.  Psychiatric:  Mood and Affect: Mood normal.        Behavior: Behavior normal.   No results found for this or any previous visit (from the past 2160 hour(s)).  Assessment/Plan: Drew Levine was seen today for annual exam.  Diagnoses and all orders for this visit:  Annual physical exam -     CMP14+EGFR  Dysuria -     CBC with Differential  Overweight (BMI 25.0-29.9) -     Lipid Panel  Encounter for HCV screening test for low risk patient -     Cancel: HCV Ab w Reflex to Quant PCR -     HCV Ab w Reflex to Quant PCR  Fatigue, unspecified type -     TSH + free T4   Alopecia    This note has been created with Education officer, environmental. Any transcriptional errors are unintentional.   Grayce Sessions, NP 05/26/2022, 10:52 AM   Grayce Sessions, NP

## 2022-05-26 NOTE — Progress Notes (Signed)
Hair is falling out Stabbing pain in head. Painful urination and discharge.

## 2022-05-26 NOTE — Patient Instructions (Signed)
Alopecia areata en los adultos Alopecia Areata, Adult  La alopecia areata es una afeccin que provoca la cada del pelo. A una persona con esta afeccin se le puede caer el pelo en zonas del cuero cabelludo. En algunos casos, la persona puede perder todo el pelo del cuero cabelludo o de la cara y el cuerpo. Tener esta afeccin puede ser difcil emocionalmente, pero no es peligrosa. La alopecia areata es una enfermedad autoinmunitaria. Esto significa que el sistema de defensa del organismo (sistema inmunitario) confunde las partes normales del cuerpo con grmenes u otras cosas que pueden causarle enfermedades. Cuando tiene alopecia areata, el sistema inmunitario ataca los folculos pilosos. Cules son las causas? Se desconoce la causa de esta afeccin. Qu incrementa el riesgo? Es ms probable que tenga esta afeccin si presenta: Antecedentes familiares de alopecia. Antecedentes familiares de otra enfermedad autoinmunitaria, incluidas diabetes tipo 1 y [place] enfermedad autoinmunitaria tiroidea. Eczema, asma y alergias. Sndrome de Down. Cules son los signos o sntomas? El sntoma principal de esta afeccin son parches redondos calvos en el cuero cabelludo. Los parches pueden causar una leve picazn. Otros sntomas pueden incluir los siguientes: Pelo oscuro y Education officer, community en los parches calvos que es ms ancho en las puntas (pelo en signo de Geophysicist/field seismologist). Marcas, manchas blancas o lneas en las uas de los dedos de la mano o del pie. Calvicie y prdida del vello corporal. Esto es poco frecuente. La alopecia areata suele manifestarse en la niez, pero puede ocurrir a Actuary. En algunas personas, el pelo vuelve a crecer y no se vuelve a caer. En otros nios, el pelo se puede caer y crecer en ciclos. La prdida de pelo puede durar varios aos. Cmo se diagnostica? Esta afeccin se diagnostica en funcin de los sntomas y los antecedentes familiares. El mdico tambin le revisar la piel del cuero  cabelludo, los dientes y las uas. El mdico puede derivarlo a un mdico especialista en enfermedades capilares o de la piel (dermatlogo). Tambin pueden [employer], que incluyen los siguientes: Una prueba de traccin capilar. Anlisis de sangre u otras pruebas de deteccin para Engineer, manufacturing la presencia de enfermedades autoinmunitarias, como trastornos de la tiroides o diabetes. Biopsia de la piel para confirmar el diagnstico. Un procedimiento para examinar la piel con un instrumento de aumento con luz (dermatoscopia). Cmo se trata? La alopecia areata no tiene [place]. Los [employer] del tratamiento son promover el nuevo crecimiento del pelo y prevenir la reaccin excesiva del sistema inmunitario. No hay un nico tratamiento para las todas las personas con alopecia areata. Depender del tipo de cada del pelo y de la gravedad. Trabaje con el mdico para encontrar el tratamiento ms adecuado para usted. El tratamiento puede incluir: Hacer controles peridicos para asegurarse de que la afeccin no est empeorando. A veces, esto se conoce como conducta expectante. Usar cremas o comprimidos con corticoesteroides durante 6 a 8 semanas para detener la reaccin inmunitaria y para que el pelo vuelva a crecer ms rpido. Usar otros medicamentos en la piel (medicamentos tpicos) para Psychologist, educational respuesta del sistema inmunitario y ayudar al ciclo del crecimiento del pelo. Inyecciones de corticoesteroides. Psicoterapia y asesoramiento psicolgico con un grupo de apoyo o un terapeuta si tiene dificultades para lidiar con la cada del pelo. Siga estas instrucciones en su casa: Medicamentos Aplique las cremas tpicas solamente como se lo haya indicado el mdico. Use los medicamentos de venta libre y los recetados solamente como se lo haya indicado el mdico. Instrucciones generales Infrmese todo lo que  pueda sobre su enfermedad. Considere usar peluca o productos que hagan que el pelo luzca con ms volumen o  cubra los parches calvos si se siente a disgusto con su aspecto. Busque psicoterapia o asesoramiento psicolgico si le cuesta lidiar con la cada del pelo. Pida al mdico que le recomiende un asesor o un grupo de apoyo. Concurra a todas las visitas de [address] se lo haya indicado el mdico. Esto es importante. Dnde buscar ms informacin National Alopecia Areata Foundation (Fundacin Nacional para la Alopecia Areata): naaf.org Comunquese con un mdico si: La cada del pelo empeora, incluso con tratamiento. Aparecen nuevos sntomas. Tiene problemas emocionales. Solicite ayuda de inmediato si: Tiene un empeoramiento repentino de la cada del pelo. Resumen La alopecia areata es una afeccin autoinmunitaria que hace que el sistema de defensa del organismo (sistema inmunitario) ataque los folculos pilosos. Esto ocasiona la cada del pelo. Tener esta afeccin puede ser difcil emocionalmente, pero no es peligrosa. Entre los tratamientos se incluyen controles peridicos para asegurarse de que la afeccin no est empeorando, medicamentos e inyecciones con corticoesteroides. Esta informacin no tiene Theme park manager el consejo del mdico. Asegrese de hacerle al mdico cualquier pregunta que tenga. Document Revised: 05/19/2019 Document Reviewed: 05/19/2019 Elsevier Patient Education  2023 [employer].

## 2022-05-27 ENCOUNTER — Other Ambulatory Visit (INDEPENDENT_AMBULATORY_CARE_PROVIDER_SITE_OTHER): Payer: Self-pay | Admitting: Primary Care

## 2022-05-27 DIAGNOSIS — F411 Generalized anxiety disorder: Secondary | ICD-10-CM

## 2022-05-27 DIAGNOSIS — R12 Heartburn: Secondary | ICD-10-CM

## 2022-05-27 LAB — CBC WITH DIFFERENTIAL/PLATELET
Basophils Absolute: 0 10*3/uL (ref 0.0–0.2)
Basos: 1 %
EOS (ABSOLUTE): 0.2 10*3/uL (ref 0.0–0.4)
Eos: 3 %
Hematocrit: 48.8 % (ref 37.5–51.0)
Hemoglobin: 16.7 g/dL (ref 13.0–17.7)
Immature Grans (Abs): 0 10*3/uL (ref 0.0–0.1)
Immature Granulocytes: 0 %
Lymphocytes Absolute: 2.4 10*3/uL (ref 0.7–3.1)
Lymphs: 32 %
MCH: 29.5 pg (ref 26.6–33.0)
MCHC: 34.2 g/dL (ref 31.5–35.7)
MCV: 86 fL (ref 79–97)
Monocytes Absolute: 0.6 10*3/uL (ref 0.1–0.9)
Monocytes: 8 %
Neutrophils Absolute: 4.3 10*3/uL (ref 1.4–7.0)
Neutrophils: 56 %
Platelets: 321 10*3/uL (ref 150–450)
RBC: 5.67 x10E6/uL (ref 4.14–5.80)
RDW: 12.6 % (ref 11.6–15.4)
WBC: 7.6 10*3/uL (ref 3.4–10.8)

## 2022-05-27 LAB — LIPID PANEL
Chol/HDL Ratio: 5 ratio (ref 0.0–5.0)
Cholesterol, Total: 136 mg/dL (ref 100–199)
HDL: 27 mg/dL — ABNORMAL LOW (ref >39)
LDL Chol Calc (NIH): 68 mg/dL (ref 0–99)
Triglycerides: 251 mg/dL — ABNORMAL HIGH (ref 0–149)
VLDL Cholesterol Cal: 41 mg/dL — ABNORMAL HIGH (ref 5–40)

## 2022-05-27 LAB — CMP14+EGFR
ALT: 29 IU/L (ref 0–44)
AST: 21 IU/L (ref 0–40)
Albumin/Globulin Ratio: 1.8 (ref 1.2–2.2)
Albumin: 4.5 g/dL (ref 4.1–5.1)
Alkaline Phosphatase: 95 IU/L (ref 44–121)
BUN/Creatinine Ratio: 21 — ABNORMAL HIGH (ref 9–20)
BUN: 13 mg/dL (ref 6–20)
Bilirubin Total: 0.2 mg/dL (ref 0.0–1.2)
CO2: 24 mmol/L (ref 20–29)
Calcium: 9.5 mg/dL (ref 8.7–10.2)
Chloride: 102 mmol/L (ref 96–106)
Creatinine, Ser: 0.62 mg/dL — ABNORMAL LOW (ref 0.76–1.27)
Globulin, Total: 2.5 g/dL (ref 1.5–4.5)
Glucose: 96 mg/dL (ref 70–99)
Potassium: 4.1 mmol/L (ref 3.5–5.2)
Sodium: 141 mmol/L (ref 134–144)
Total Protein: 7 g/dL (ref 6.0–8.5)
eGFR: 128 mL/min/{1.73_m2} (ref >59)

## 2022-05-27 LAB — HCV INTERPRETATION

## 2022-05-27 LAB — TSH+FREE T4
Free T4: 1.03 ng/dL (ref 0.82–1.77)
TSH: 3.39 u[IU]/mL (ref 0.450–4.500)

## 2022-05-27 LAB — HCV AB W REFLEX TO QUANT PCR: HCV Ab: NONREACTIVE

## 2022-05-27 MED ORDER — OMEPRAZOLE 40 MG PO CPDR: DELAYED_RELEASE_CAPSULE | ORAL | 2 refills | Status: DC

## 2022-05-27 MED ORDER — FLUOXETINE HCL 20 MG PO TABS: ORAL_TABLET | ORAL | 1 refills | Status: DC

## 2022-06-01 LAB — POCT URINALYSIS DIP (CLINITEK)
Bilirubin, UA: NEGATIVE
Blood, UA: NEGATIVE
Glucose, UA: NEGATIVE mg/dL
Ketones, POC UA: NEGATIVE mg/dL
Leukocytes, UA: NEGATIVE
Nitrite, UA: NEGATIVE
POC PROTEIN,UA: 30 — AB
Spec Grav, UA: 1.015 (ref 1.010–1.025)
Urobilinogen, UA: 0.2 E.U./dL
pH, UA: 7.5 (ref 5.0–8.0)

## 2022-06-01 NOTE — Addendum Note (Signed)
Addended by: Herbert Deaner on: 06/01/2022 10:19 AM   Modules accepted: Orders

## 2022-06-08 ENCOUNTER — Other Ambulatory Visit (INDEPENDENT_AMBULATORY_CARE_PROVIDER_SITE_OTHER): Payer: Self-pay | Admitting: Primary Care

## 2022-06-08 ENCOUNTER — Other Ambulatory Visit: Payer: Self-pay

## 2022-06-08 DIAGNOSIS — E782 Mixed hyperlipidemia: Secondary | ICD-10-CM

## 2022-06-08 MED ORDER — PRAVASTATIN SODIUM 80 MG PO TABS
80.0000 mg | ORAL_TABLET | Freq: Every day | ORAL | 3 refills | Status: DC
  Filled 2022-06-08 – 2022-07-27 (×2): qty 30, 30d supply, fill #0

## 2022-06-09 ENCOUNTER — Encounter (INDEPENDENT_AMBULATORY_CARE_PROVIDER_SITE_OTHER): Payer: Self-pay

## 2022-06-15 ENCOUNTER — Other Ambulatory Visit: Payer: Self-pay

## 2022-06-29 ENCOUNTER — Other Ambulatory Visit (INDEPENDENT_AMBULATORY_CARE_PROVIDER_SITE_OTHER): Payer: Self-pay | Admitting: Primary Care

## 2022-06-29 DIAGNOSIS — F411 Generalized anxiety disorder: Secondary | ICD-10-CM

## 2022-06-29 NOTE — Telephone Encounter (Signed)
Medication Refill - Medication: FLUoxetine (PROZAC) 20 MG tablet   Pt was unsure of the name of the name of the medication and said it was for his nerves.  Has the patient contacted their pharmacy? Yes.   No, more refills.   (Agent: If yes, when and what did the pharmacy advise?)  Preferred Pharmacy (with phone number or street name):  [id number] [place], [place] - [address]  [address] [place] [address]  Phone: [phone] Fax: [phone]  Hours: Not open 24 hours   Has the patient been seen for an appointment in the last year OR does the patient have an upcoming appointment? Yes.    Agent: Please be advised that RX refills may take up to 3 business days. We ask that you follow-up with your pharmacy.

## 2022-06-30 ENCOUNTER — Telehealth (INDEPENDENT_AMBULATORY_CARE_PROVIDER_SITE_OTHER): Payer: Self-pay | Admitting: Primary Care

## 2022-06-30 NOTE — Telephone Encounter (Signed)
Copied from CRM [id number]. Topic: General - Other >> Jun 30, 2022  8:19 AM Everette C wrote: Reason for CRM: Medication Refill - Medication: FLUoxetine (PROZAC) 20 MG tablet [[id number]]   Has the patient contacted their pharmacy? Yes.   (Agent: If no, request that the patient contact the pharmacy for the refill. If patient does not wish to contact the pharmacy document the reason why and proceed with request.) (Agent: If yes, when and what did the pharmacy advise?)  Preferred Pharmacy (with phone number or street name): [id number] [place], [place] - [address] [place] [address] Phone: [phone] Fax: [phone] Hours: Not open 24 hours   Has the patient been seen for an appointment in the last year OR does the patient have an upcoming appointment? Yes.    Agent: Please be advised that RX refills may take up to 3 business days. We ask that you follow-up with your pharmacy.

## 2022-06-30 NOTE — Telephone Encounter (Signed)
Last RF 05/27/22 #90 1 RF  Requested Prescriptions  Refused Prescriptions Disp Refills   FLUoxetine (PROZAC) 20 MG tablet 90 tablet 1    Sig: TOME UNA TABLETA TODOS LOS DIAS     Psychiatry:  Antidepressants - SSRI Passed - 06/29/2022 11:16 AM      Passed - Valid encounter within last 6 months    Recent Outpatient Visits           1 month ago Annual physical exam   [place] [facility] Grayce Sessions, NP   12 months ago Heat rash   [place] [facility] Grayce Sessions, NP   2 years ago Alopecia   [place] [facility] Grayce Sessions, NP   2 years ago Routine general medical examination at a health care facility   [facility] Grayce Sessions, NP   4 years ago 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

## 2022-06-30 NOTE — Telephone Encounter (Signed)
[facility] called and spoke to Drew Levine, [facility] about the refill(s) fluoxetine requested. Advised it was sent on 05/27/22 #90/2 refill(s). He says they have it and will refill for the patient.

## 2022-07-21 NOTE — Congregational Nurse Program (Signed)
  Dept: [phone]   Congregational Nurse Program Note  Date of Encounter: 07/21/2022  Past Medical History: No past medical history on file.  Encounter Details:  CNP Questionnaire - 07/21/22 1521       Questionnaire   Ask client: Do you give verbal consent for me to treat you today? Yes    Actuary Nurse    Location Patient Presenter, broadcasting    Visit Setting with Client Organization    Patient Status Migrant    Insurance Uninsured ([place] Card/Care Connects/Self-Pay/Medicaid Family Planning)    Insurance/Financial Assistance Referral [employer] Card/Care Connects    Medication N/A    Medical Provider Yes    Screening Referrals Made N/A    Medical Referrals Made [facility]    Medical Appointment Made N/A    Recently w/o PCP, now 1st time PCP visit completed due to CNs referral or appointment made N/A    Food N/A    Housing/Utilities N/A    Interpersonal Safety N/A    Interventions N/A    Abnormal to Normal Screening Since Last CN Visit N/A    Screenings CN Performed N/A    Sent Client to Lab for: N/A    Did client attend any of the following based off CNs referral or appointments made? N/A    ED Visit Averted N/A    Life-Saving Intervention Made N/A            Client here for orange card application, also has a concern for balding spots on head. Encouraged to apply for orange card application since he is paying out of pocket for medication and to make a f/u appt with his PCP.

## 2022-07-26 ENCOUNTER — Other Ambulatory Visit (INDEPENDENT_AMBULATORY_CARE_PROVIDER_SITE_OTHER): Payer: Self-pay | Admitting: Primary Care

## 2022-07-26 DIAGNOSIS — F411 Generalized anxiety disorder: Secondary | ICD-10-CM

## 2022-07-27 ENCOUNTER — Other Ambulatory Visit: Payer: Self-pay

## 2022-08-03 ENCOUNTER — Other Ambulatory Visit: Payer: Self-pay

## 2022-08-25 ENCOUNTER — Encounter (INDEPENDENT_AMBULATORY_CARE_PROVIDER_SITE_OTHER): Payer: Self-pay

## 2022-08-25 ENCOUNTER — Ambulatory Visit (INDEPENDENT_AMBULATORY_CARE_PROVIDER_SITE_OTHER): Payer: Self-pay | Admitting: Primary Care

## 2022-09-22 ENCOUNTER — Ambulatory Visit (INDEPENDENT_AMBULATORY_CARE_PROVIDER_SITE_OTHER): Payer: Self-pay | Admitting: Primary Care

## 2022-09-22 ENCOUNTER — Encounter (INDEPENDENT_AMBULATORY_CARE_PROVIDER_SITE_OTHER): Payer: Self-pay | Admitting: Primary Care

## 2022-09-22 VITALS — BP 115/77 | HR 63 | Resp 16 | Wt 173.2 lb

## 2022-09-22 DIAGNOSIS — K219 Gastro-esophageal reflux disease without esophagitis: Secondary | ICD-10-CM | POA: Insufficient documentation

## 2022-09-22 DIAGNOSIS — E663 Overweight: Secondary | ICD-10-CM

## 2022-09-22 DIAGNOSIS — K2100 Gastro-esophageal reflux disease with esophagitis, without bleeding: Secondary | ICD-10-CM

## 2022-09-22 DIAGNOSIS — R12 Heartburn: Secondary | ICD-10-CM

## 2022-09-22 MED ORDER — OMEPRAZOLE 40 MG PO CPDR: DELAYED_RELEASE_CAPSULE | ORAL | 2 refills | Status: AC

## 2022-09-22 NOTE — Patient Instructions (Signed)
Opciones de alimentos para pacientes adultos con enfermedad de reflujo gastroesofgico Food Choices for Gastroesophageal Reflux Disease, Adult Si tiene enfermedad de reflujo gastroesofgico (ERGE), los alimentos que consume y los hbitos de alimentacin son muy importantes. Elegir los alimentos adecuados puede ayudar a [employer]. Piense en consultar a un experto en alimentacin (nutricionista) para que lo ayude a Associate Professor. Consejos para seguir Consulting civil engineer las etiquetas de los alimentos Elija alimentos que tengan bajo contenido de grasas saturadas. Los alimentos que pueden ayudar con los sntomas incluyen los siguientes: Alimentos que tienen menos del 5 % de los valores diarios (VD) de grasa. Alimentos que tienen 0 gramos de grasas trans. Al cocinar No frer los alimentos. Cocinar la comida al horno, al vapor, a la plancha o en la parrilla. Estos son mtodos que no necesitan mucha grasa para Water quality scientist. Para agregar sabor, trate de consumir hierbas con bajo contenido de picante y [place]. Planificacin de las comidas  Elegir alimentos saludables con bajo contenido de [place], por ejemplo: Frutas y vegetales. Cereales integrales. Productos lcteos con bajo contenido de [place]. Vita Bar[place], pescado y aves. Haga comidas pequeas frecuentes en lugar de 3 comidas abundantes al da. Coma lentamente, en un lugar donde est relajado. Evite agacharse o recostarse hasta 2 o 3 horas despus de haber comido. Limite los alimentos con alto contenido graso como las carnes grasas o los alimentos fritos. Limite su ingesta de alimentos grasos, como aceites, [place] y [place]. Evite lo siguiente si el mdico se lo indica: Consumir alimentos que le ocasionen sntomas. Pueden ser distintos para cada persona. Lleve un registro de los alimentos para identificar aquellos que le causen sntomas. Alcohol. Beber mucha cantidad de lquido con las comidas. Comer 2 o 3 horas antes de  acostarse. Estilo de vida Mantenga un peso saludable. Pregntele a su mdico cul es el peso saludable para usted. Si necesita perder peso, hable con su mdico para hacerlo de manera segura. Haga actividad fsica durante, al menos, 30 minutos 5 das por semana o ms, o como se lo haya indicado el mdico. Use ropa suelta. No fume ni consuma ningn producto que contenga nicotina o tabaco. Si necesita ayuda para dejar de fumar, consulte al mdico. Duerma con la cabecera de la cama ms elevada que los pies. Use una cua debajo del colchn o bloques debajo del armazn de la cama para Pharmacologist la cabecera de la cama elevada. Mastique chicle sin azcar despus de las comidas. Qu alimentos debo comer?  Siga una dieta saludable y bien equilibrada que incluya abundantes frutas, verduras, cereales integrales, productos lcteos descremados, carnes magras, pescado y aves. Cada persona es diferente. Los alimentos que pueden causar sntomas en una persona pueden no causar sntomas en otra persona. Trabaje con el mdico para hallar alimentos que sean seguros para usted. Es posible que los productos que se enumeran ms [place] no constituyan una lista completa de lo que puede comer y Product manager. Pngase en contacto con un experto en alimentos para conocer ms opciones. Qu alimentos debo evitar? Limitar algunos de estos alimentos puede ayudar a Chief Operating Officer los sntomas de [place]. Cada persona es diferente. Consulte a un experto en alimentacin o a su mdico para que lo ayude a Clinical research associate los alimentos exactos que debe evitar, si los hubiere. Frutas Cualquier fruta que est preparada con grasa agregada. Cualquier fruta que le ocasione sntomas. Para algunas personas, estas pueden incluir, las frutas ctricas como naranja, pomelo, pia y limn. Verduras Verduras fritas en abundante aceite. Papas fritas.  Cualquier verdura que est preparada con grasa agregada. Cualquier verdura que le ocasione sntomas. Para algunas personas,  estas pueden incluir tomates y productos con tomate, [place], cebollas y [place], y rbanos picantes. Granos Pasteles o panes sin levadura con grasa agregada. Carnes y [address] protenas [address] de alto contenido graso como carne grasa de vaca o cerdo, salchichas, costillas, jamn, salchicha, salame y tocino. Carnes o protenas fritas, lo que incluye pescado frito y pollo frito. Frutos secos y Civil engineer, contracting de frutos secos, en grandes cantidades. Lcteos Leche entera y [place] con chocolate. Tami Ribas. Crema. Helado. Queso crema. Batidos con [employer]. Grasas y [employer]. Margarina. Lardo. Mantequilla clarificada. Bebidas Caf y t negro, con o sin cafena. Bebidas con gas. Refrescos. Bebidas energizantes. Jugo de fruta hecho con frutas cidas, como naranja o pomelo. Jugo de tomate. Bebidas alcohlicas. Dulces y postres Chocolate y cacao. [place]. Alios y condimentos Pimienta. Menta y mentol. Sal agregada. Cualquier condimento, hierbas o aderezos que le ocasionen sntomas. Para algunas personas, esto puede incluir curry, salsa picante o aderezos para ensalada a base de vinagre. Es posible que los productos que se enumeran ms arriba no constituyan una lista completa de lo que no debera comer y Product manager. Pngase en contacto con un experto en alimentos para conocer ms opciones. Preguntas para hacerle al [employer] Los cambios en la dieta y en el estilo de vida a menudo son los primeros pasos que se toman para Company secretary los sntomas de [place]. Si los [employer] dieta y el estilo de vida no ayudan, hable con el mdico sobre el uso de medicamentos. Dnde buscar ms informacin Arts development officer for Gastrointestinal Disorders (Fundacin Internacional para los Trastornos Gastrointestinales): aboutgerd.org Resumen Si tiene [employer], las elecciones de alimentos y el [place] de vida son muy importantes para ayudar a Consulting civil engineer sntomas. Haga comidas pequeas durante Glass blower/designer de 3 comidas abundantes.  Coma lentamente y en un lugar donde est relajado. Evite agacharse o recostarse hasta 2 o 3 horas despus de haber comido. Limite los alimentos con alto contenido graso como las carnes grasas o los alimentos fritos. Esta informacin no tiene Theme park manager el consejo del mdico. Asegrese de hacerle al mdico cualquier pregunta que tenga. Document Revised: 09/15/2019 Document Reviewed: 09/15/2019 Elsevier Patient Education  2024 Elsevier Inc. Recuento de caloras para bajar de peso Calorie Counting for [employer] Loss Las caloras son unidades de Engineer, drilling. El cuerpo necesita una cierta cantidad de caloras de los alimentos para que lo ayuden a funcionar durante todo Medical laboratory scientific officer. Cuando se comen o beben ms caloras de las que el cuerpo [place], este acumula las caloras adicionales mayormente como grasa. Cuando se comen o beben menos caloras de las que el cuerpo [place], este quema grasa para obtener la energa que necesita. El recuento de caloras es el registro de la cantidad de caloras que se comen y Audiological scientist. El recuento de caloras puede ser de ayuda si necesita perder peso. Si come menos caloras de las que el cuerpo necesita, debera bajar de [place]. Pregntele al mdico cul es un peso sano para usted. Para que el recuento de caloras funcione, usted tendr que ingerir la cantidad de caloras adecuadas cada da, para bajar una cantidad de peso saludable por semana. Un nutricionista puede ayudar a determinar la cantidad de caloras que usted necesita por da y sugerirle formas de Barista su objetivo calrico. Neomia Dear cantidad de peso saludable para bajar cada semana suele ser entre 1 y 2 libras (0.5 a 0.9 kg).  Esto habitualmente significa que su ingesta diaria de caloras se debera reducir en unas 500 a 750 caloras. Ingerir de 1200 a 1500 caloras por Clinical research associate a la [employer] de las mujeres a Publishing copy de [place]. Ingerir de 1500 a 1800 caloras por Clinical research associate a la [employer] de los hombres a  Publishing copy de [place]. Qu debo saber acerca del recuento de caloras? Trabaje con el mdico o el nutricionista para determinar cuntas caloras debe recibir Management consultant. A fin de alcanzar su objetivo diario de caloras, tendr que: Averiguar cuntas caloras hay en cada alimento que le Lobbyist. Intente hacerlo antes de comer. Decidir la cantidad que puede comer del alimento. Llevar un registro de los alimentos. Para esto, anote lo que comi y cuntas caloras tena. Para perder peso con xito, es importante equilibrar el recuento de caloras con un estilo de vida saludable que incluya actividad fsica de forma regular. Dnde encuentro informacin sobre las caloras?  Es posible Veterinary surgeon cantidad de caloras que contiene un alimento en la etiqueta de informacin nutricional. Si un alimento no tiene una etiqueta de informacin nutricional, intente buscar las caloras en Internet o pida ayuda al nutricionista. Recuerde que las caloras se calculan por porcin. Si opta por comer ms de una porcin de un alimento, tendr [employer] las caloras de una porcin por la cantidad de porciones que planea comer. Por ejemplo, la etiqueta de un envase de pan puede decir que el tamao de una porcin es 1 rodaja, y que una porcin tiene 90 caloras. Si come 1 rodaja, habr comido 90 caloras. Si come 2 rodajas, habr comido 180 caloras. Cmo llevo un registro de comidas? Despus de cada vez que coma, anote lo siguiente en el registro de alimentos lo antes posible: Lo que comi. Asegrese de [employer], las salsas y otros extras [employer]. La cantidad que comi. Esto se puede medir en tazas, onzas o cantidad de alimentos. Cuntas caloras haba en cada alimento y en cada bebida. La cantidad total de caloras en la comida que tom. Tenga a Scientific laboratory technician de alimentos, por ejemplo, en un anotador de bolsillo o utilice una aplicacin o sitio web en el telfono mvil. Algunos programas  calcularn las caloras por usted y Insurance account manager la cantidad de caloras que le quedan para llegar al objetivo diario. Cules son algunos consejos para controlar las porciones? Sepa cuntas caloras hay en una porcin. Esto lo ayudar a saber cuntas porciones de un alimento determinado puede comer. Use una taza medidora para medir los tamaos de las porciones. Tambin Hydrographic surveyor las porciones en una balanza de cocina. Con el tiempo, podr hacer un clculo estimativo de los tamaos de las porciones de algunos alimentos. Dedique tiempo a poner porciones de diferentes alimentos en sus platos, tazones y tazas predilectos, a fin de saber cmo se ve una porcin. Intente no comer directamente de un envase de alimentos, por ejemplo, de una bolsa o una caja. Comer directamente del envase dificulta ver cunto est comiendo y puede conducir a Actuary. Ponga la cantidad [employer] gustara comer en una taza o un plato, a fin de asegurarse de que est comiendo la porcin correcta. Use platos, vasos y tazones ms pequeos para medir porciones ms pequeas y Automotive engineer no comer en exceso. Intente no realizar varias tareas al [employer]. Por ejemplo, evite mirar televisin o usar la [employer] come. Si es la hora de comer, sintese a Museum/gallery conservator y disfrute  de la comida. Esto lo ayudar a Public house manager cundo est satisfecho. Tambin le permitir estar ms consciente de qu come y cunto come. Consejos para seguir Surveyor, minerals Al leer las etiquetas de los alimentos Controle el recuento de caloras en comparacin con el tamao de la porcin. El tamao de la porcin puede ser ms pequeo de lo que suele comer. Verifique la fuente de las caloras. Intente elegir alimentos ricos en protenas, fibras y vitaminas, y bajos en grasas saturadas, grasas trans y [place]. Al ir de compras Lea las etiquetas nutricionales cuando compre. Esto lo ayudar a tomar decisiones saludables sobre qu alimentos comprar. Preste  atencin a las etiquetas nutricionales de alimentos bajos en grasas o sin grasas. Estos alimentos a veces tienen la misma cantidad de caloras o ms caloras que las versiones ricas en grasas. Con frecuencia, tambin tienen agregados de azcar, almidn o sal, para darles el sabor que fue eliminado con las grasas. Haga una lista de compras con los alimentos que tienen un menor contenido de caloras y Leisure centre manager. Al cocinar Intente cocinar sus alimentos preferidos de una manera ms saludable. Por ejemplo, pruebe hornear en vez de frer. Utilice productos lcteos descremados. Planificacin de las comidas Utilice ms frutas y verduras. La mitad de su plato debe ser de frutas y verduras. Incluya protenas magras, como pollo, pavo y [place]. Estilo de [employer], trate de hacer una de las siguientes cosas: 150 minutos de ejercicio moderado, como caminar. 75 minutos de ejercicio enrgico, como correr. Informacin general Sepa cuntas caloras tienen los alimentos que come con ms frecuencia. Esto le ayudar a contar las caloras ms rpidamente. Encuentre un mtodo para controlar las caloras que funcione para usted. Sea creativo. Pruebe aplicaciones o programas distintos, si llevar un registro de las caloras no funciona para usted. Qu alimentos debo consumir?  Consuma alimentos nutritivos. Es mejor comer un alimento nutritivo, de alto contenido calrico, como un aguacate, que uno con pocos nutrientes, como una bolsa de patatas fritas. Use sus caloras en alimentos y bebidas que lo sacien y no lo dejen con apetito apenas termina de comer. Ejemplos de alimentos que lo sacian son los frutos secos y Civil engineer, contracting de frutos secos, verduras, Associate Professor y Forensic scientist con alto contenido de Research scientist (life sciences) como los cereales integrales. Los alimentos con alto contenido de [place] son aquellos que tienen ms de 5 g de fibra por porcin. Preste atencin a las [employer] Brands. Las bebidas de bajas caloras  incluyen agua y refrescos sin International aid/development worker. Es posible que los productos que se enumeran ms [place] no constituyan una lista completa de los alimentos y las bebidas que puede tomar. Consulte a un nutricionista para obtener ms informacin. Qu alimentos debo limitar? Limite el consumo de alimentos o bebidas que no sean buenas fuentes de vitaminas, minerales o protenas, o que tengan alto contenido de grasas no saludables. Estos incluyen: Caramelos. Otros dulces. Refrescos, bebidas con caf especiales, alcohol y [place]. Es posible que los productos que se enumeran ms [place] no constituyan una lista completa de los alimentos y las bebidas que Personnel officer. Consulte a un nutricionista para obtener ms informacin. Cmo puedo hacer el recuento de caloras cuando como afuera? Preste atencin a las porciones. A menudo, las porciones son mucho ms grandes al comer afuera. Pruebe con estos consejos para mantener las porciones ms pequeas: Considere la posibilidad de compartir una comida en lugar de tomarla toda usted solo. Si pide su propia comida, coma solo la mitad. Antes de empezar a comer, pida  un recipiente y ponga la mitad de la comida en l. Cuando sea posible, considere la posibilidad de pedir porciones ms pequeas del men en lugar de porciones completas. Preste atencin a Soil scientist de alimentos y bebidas. Saber la forma en que se cocinan los alimentos y lo que incluye la comida puede ayudarlo a ingerir menos caloras. Si se detallan las caloras en el men, elija las opciones que contengan la menor cantidad. Elija platos que incluyan verduras, frutas, cereales integrales, productos lcteos con bajo contenido de grasa y Associate Professor. Opte por los alimentos hervidos, asados, cocidos a la parrilla o al vapor. Evite los alimentos a los que se les ponga mantequilla, que estn empanados o fritos, o que se sirvan con salsa a base de crema. Generalmente, los alimentos que se etiquetan como "crujientes"  estn fritos, a menos que se indique lo contrario. Elija el agua, la [place], [place] t helado sin azcar u otras bebidas que no contengan azcares agregados. Si desea una bebida alcohlica, escoja una opcin con menos caloras, como una copa de vino o una cerveza ligera. Ordene los [employer], las salsas y los jarabes aparte. Estos son, con frecuencia, de alto contenido en caloras, por lo que debe limitar la cantidad que ingiere. Si desea [place], elija una de hortalizas y pida carnes a la parrilla. Evite las guarniciones adicionales como el tocino, el queso o los alimentos fritos. Ordene el aderezo aparte o pida aceite de [place] y vinagre o limn para Emergency planning/management officer. Haga un clculo estimativo de la cantidad de porciones que le sirven. Conocer el tamao de las porciones lo ayudar a Theme park manager atento a la cantidad de comida que come [employer]. Dnde buscar ms informacin Centers for Disease Control and Prevention (Centros para el Control y la Prevencin de Enfermedades): FootballExhibition.com.br U.S. Department of Agriculture (Departamento de Agricultura de los EE. UU.): WrestlingReporter.dk Resumen El recuento de caloras es el registro de la cantidad de caloras que se comen y Audiological scientist. Si come menos caloras de las que el cuerpo necesita, debera bajar de [place]. Una cantidad de peso saludable para bajar por semana suele ser entre 1 y 2 libras (0.5 a 0.9 kg). Esto significa, con frecuencia, reducir su ingesta diaria de caloras unas 500 a 750 caloras. Es posible Veterinary surgeon cantidad de caloras que contiene un alimento en la etiqueta de informacin nutricional. Si un alimento no tiene una etiqueta de informacin nutricional, intente buscar las caloras en Internet o pida ayuda al nutricionista. Use platos, vasos y tazones ms pequeos para medir porciones ms pequeas y Automotive engineer no comer en exceso. Use sus caloras en alimentos y bebidas que lo sacien y no lo dejen con apetito poco tiempo despus de haber  comido. Esta informacin no tiene Theme park manager el consejo del mdico. Asegrese de hacerle al mdico cualquier pregunta que tenga. Document Revised: 05/29/2019 Document Reviewed: 05/29/2019 Elsevier Patient Education  2023 [employer].

## 2022-09-22 NOTE — Progress Notes (Signed)
[facility]  Drew Levine, is a 37 y.o. male  ZOX:[id number]  [id number]  DOB - October 15, 1985  Chief Complaint  Patient presents with   Edema     Abdominal        Subjective:   Drew Levine is a 37 y.o. Hispanic male( interpreter Drew Levine [phone]) here today for an acute  follow up visit for abdominal pain . His son was present and spilled everything , hot spicy foot , using hot sauce on every thing.Patient has No headache, No chest pain, No abdominal pain - No Nausea, No new weakness tingling or numbness, No Cough - shortness of breath  No problems updated.  No Known Allergies  No past medical history on file.  Current Outpatient Medications on File Prior to Visit  Medication Sig Dispense Refill   FLUoxetine (PROZAC) 20 MG tablet TOME UNA TABLETA TODOS LOS DIAS 90 tablet 3   pravastatin (PRAVACHOL) 80 MG tablet Take 1 tablet (80 mg total) by mouth daily. 90 tablet 3   No current facility-administered medications on file prior to visit.    Objective:   Vitals:   09/22/22 1030  BP: 115/77  Pulse: 63  Resp: 16  SpO2: 97%  Weight: 173 lb 3.2 oz (78.6 kg)    Comprehensive ROS Pertinent positive and negative noted in HPI   Exam General appearance : Awake, alert, not in any distress. Speech Clear. Not toxic looking HEENT: Atraumatic and Normocephalic, pupils equally reactive to light and accomodation Neck: Supple, no JVD. No cervical lymphadenopathy.  Chest: Good air entry bilaterally, no added sounds  CVS: S1 S2 regular, no murmurs.  Abdomen: Bowel sounds present, Non tender and not distended with no gaurding, rigidity or rebound. Extremities: B/L Lower Ext shows no edema, both legs are warm to touch Neurology: Awake alert, and oriented X 3, CN II-XII intact, Non focal Skin: No Rash  Data Review No results found for: "HGBA1C"  Assessment & Plan     Drew Levine was seen today for abdominal pain  Diagnoses and all orders for this  visit:  Gastroesophageal reflux disease with esophagitis without hemorrhage 2/2 Heartburn Discussed eating small frequent meal, reduction in acidic foods, fried foods ,spicy foods, alcohol caffeine and tobacco and certain medications. Avoid laying down after eating 6mins-1hour, elevated head of the bed.  -     omeprazole (PRILOSEC) 40 MG capsule; TOME UNA CAPSULA TODOS LOS DIAS  Overweight (BMI 25.0-29.9)  Discussed diet and exercise for person with BMI >25. Instructed: You must burn more calories than you eat. Losing 5 percent of your body weight should be considered a success. In the longer term, losing more than 15 percent of your body weight and staying at this weight is an extremely good result. However, keep in mind that even losing 5 percent of your body weight leads to important health benefits, so try not to get discouraged if you're not able to lose more than this. Will recheck weight in 3-6 months.    Patient have been counseled extensively about nutrition and exercise. Other issues discussed during this visit include: low cholesterol diet, weight control and daily exercise, foot care, annual eye examinations at Ophthalmology, importance of adherence with medications and regular follow-up. We also discussed long term complications of uncontrolled diabetes and hypertension.   No follow-ups on file.  The patient was given clear instructions to go to ER or return to medical center if symptoms don't improve, worsen or new problems develop. The patient verbalized understanding.  The patient was told to call to get lab results if they haven't heard anything in the next week.   This note has been created with Education officer, environmental. Any transcriptional errors are unintentional.   Grayce Sessions, NP 09/22/2022, 11:12 AM

## 2022-11-16 ENCOUNTER — Encounter (HOSPITAL_COMMUNITY): Payer: Self-pay | Admitting: Emergency Medicine

## 2022-11-16 ENCOUNTER — Other Ambulatory Visit: Payer: Self-pay

## 2022-11-16 ENCOUNTER — Emergency Department (HOSPITAL_COMMUNITY)
Admission: EM | Admit: 2022-11-16 | Discharge: 2022-11-17 | Disposition: A | Discharge status: Home / Self Care | Payer: Self-pay | Attending: Emergency Medicine | Admitting: Emergency Medicine

## 2022-11-16 DIAGNOSIS — W260XXA Contact with knife, initial encounter: Secondary | ICD-10-CM | POA: Insufficient documentation

## 2022-11-16 DIAGNOSIS — Y990 Civilian activity done for income or pay: Secondary | ICD-10-CM | POA: Insufficient documentation

## 2022-11-16 DIAGNOSIS — S61012A Laceration without foreign body of left thumb without damage to nail, initial encounter: Secondary | ICD-10-CM | POA: Insufficient documentation

## 2022-11-16 HISTORY — DX: Pure hypercholesterolemia, unspecified: E78.00

## 2022-11-16 NOTE — ED Triage Notes (Signed)
Laceration to R thumb from knife at work. Tdap 2 years ago. Not on thinner. No nail involvement. Distal sensation intact. Bleeding controlled with wrap in triage.

## 2022-11-17 ENCOUNTER — Emergency Department (HOSPITAL_COMMUNITY): Payer: Self-pay

## 2022-11-17 MED ORDER — LIDOCAINE HCL (PF) 1 % IJ SOLN
30.0000 mL | Freq: Once | INTRAMUSCULAR | Status: AC
  Administered 2022-11-17: 30 mL
  Filled 2022-11-17: qty 30

## 2022-11-17 MED ORDER — CEPHALEXIN 500 MG PO CAPS
500.0000 mg | ORAL_CAPSULE | Freq: Two times a day (BID) | ORAL | 0 refills | Status: AC
Start: 2022-11-17 — End: ?

## 2022-11-17 NOTE — ED Provider Notes (Signed)
[place] EMERGENCY DEPARTMENT AT [facility] Provider Note   CSN: [id number] Arrival date & time: 11/16/22  2013     History  Chief Complaint  Patient presents with   Laceration    Drew Levine is a 37 y.o. male past medical history significant for hyperlipidemia and GERD presents today for left thumb laceration from a knife at work.  Patient states this occurred around 7 PM last night.  He endorses pain which limits range of motion.  He denies blood thinner use and states that his last Tdap was approximately 2 years ago.  Patient was interviewed using a professionally language interpreter.   Laceration Associated symptoms: no fever        Home Medications Prior to Admission medications   Medication Sig Start Date End Date Taking? Authorizing Provider  cephALEXin (KEFLEX) 500 MG capsule Take 1 capsule (500 mg total) by mouth 2 (two) times daily. 11/17/22  Yes Dolphus Jenny, PA-C  FLUoxetine (PROZAC) 20 MG tablet TOME UNA TABLETA TODOS LOS DIAS 07/26/22   Grayce Sessions, NP  omeprazole (PRILOSEC) 40 MG capsule TOME UNA CAPSULA TODOS LOS DIAS 09/22/22   Grayce Sessions, NP  pravastatin (PRAVACHOL) 80 MG tablet Take 1 tablet (80 mg total) by mouth daily. 06/08/22   Grayce Sessions, NP      Allergies    Patient has no known allergies.    Review of Systems   Review of Systems  Constitutional:  Negative for chills and fever.  Respiratory:  Negative for shortness of breath.   Cardiovascular:  Negative for chest pain.  Gastrointestinal:  Negative for nausea and vomiting.  Skin:  Positive for wound.  Neurological:  Negative for weakness.    Physical Exam Updated Vital Signs BP 125/79   Pulse 63   Temp 97.6 F (36.4 C)   Resp 18   Wt 78 kg   SpO2 99%   BMI 27.75 kg/m  Physical Exam Vitals and nursing note reviewed.  Constitutional:      General: He is not in acute distress.    Appearance: He is well-developed.  HENT:     Head:  Normocephalic and atraumatic.  Eyes:     Conjunctiva/sclera: Conjunctivae normal.  Cardiovascular:     Rate and Rhythm: Normal rate and regular rhythm.     Heart sounds: No murmur heard. Pulmonary:     Effort: Pulmonary effort is normal. No respiratory distress.     Breath sounds: Normal breath sounds.  Abdominal:     Palpations: Abdomen is soft.     Tenderness: There is no abdominal tenderness.  Musculoskeletal:        General: No swelling.     Cervical back: Neck supple.  Skin:    General: Skin is warm and dry.     Comments: Approximately 2-1/2 cm laceration to the dorsal side of the patient's thumb without nail involvement.  ROM was difficult to assess due to patient's pain.  Neurological:     Mental Status: He is alert.  Psychiatric:        Mood and Affect: Mood normal.     ED Results / Procedures / Treatments   Labs (all labs ordered are listed, but only abnormal results are displayed) Labs Reviewed - No data to display  EKG None  Radiology DG Hand Complete Left  Result Date: 11/17/2022 CLINICAL DATA:  Accidental left thumb stabbing injury. EXAM: LEFT HAND - COMPLETE 3+ VIEW COMPARISON:  None Available. FINDINGS: There  is no evidence of fracture or dislocation. There is no evidence of arthropathy or other focal bone abnormality. There is moderate swelling along the thumb, with overlying bandaging. No soft tissue gas or radiopaque foreign body. IMPRESSION: Soft tissue swelling along the thumb, with no fracture or dislocation. No radiopaque foreign body. Electronically Signed   By: Almira Bar M.D.   On: 11/17/2022 06:33    Procedures .Marland KitchenLaceration Repair  Date/Time: 11/17/2022 6:14 AM  Performed by: Dolphus Jenny, PA-C Authorized by: Dolphus Jenny, PA-C   Consent:    Consent obtained:  Verbal   Consent given by:  Patient   Risks, benefits, and alternatives were discussed: yes     Risks discussed:  Infection, need for additional repair and pain   Alternatives  discussed:  No treatment Universal protocol:    Procedure explained and questions answered to patient or proxy's satisfaction: yes     Relevant documents present and verified: yes     Test results available: no     Imaging studies available: no     Required blood products, implants, devices, and special equipment available: no     Site/side marked: yes     Immediately prior to procedure, a time out was called: yes     Patient identity confirmed:  Arm band Anesthesia:    Anesthesia method:  Local infiltration   Local anesthetic:  Lidocaine 1% w/o epi Laceration details:    Location:  Finger   Finger location:  L thumb   Length (cm):  3   Depth (mm):  3 Exploration:    Hemostasis achieved with:  Direct pressure   Imaging obtained: x-ray     Wound exploration: wound explored through full range of motion and entire depth of wound visualized     Contaminated: yes   Treatment:    Area cleansed with:  Chlorhexidine and saline   Amount of cleaning:  Extensive   Irrigation solution:  Sterile saline   Irrigation method:  Syringe   Visualized foreign bodies/material removed: no     Debridement:  None Skin repair:    Repair method:  Sutures   Suture size:  4-0   Wound skin closure material used: Vicryl.   Suture technique:  Simple interrupted   Number of sutures:  6 Approximation:    Approximation:  Close Repair type:    Repair type:  Simple Post-procedure details:    Dressing:  Splint for protection and non-adherent dressing   Procedure completion:  Tolerated     Medications Ordered in ED Medications  lidocaine (PF) (XYLOCAINE) 1 % injection 30 mL (30 mLs Infiltration Given by Other 11/17/22 0424)    ED Course/ Medical Decision Making/ A&P                                 Medical Decision Making This patient presents to the ED with chief complaint(s) of left thumb laceration with pertinent past medical history of hyperlipidemia, GERD which further complicates the presenting  complaint. The complaint involves an extensive differential diagnosis and also carries with it a high risk of complications and morbidity.    The differential diagnosis includes laceration   ED Course and Reassessment Patient was placed in a thumb spica, neurovascular was intact and sensation intact prior to discharge.  Independent visualization of imaging: - I independently visualized the following imaging with scope of interpretation limited to determining acute life threatening conditions related  to emergency care: Hand x-ray left, which revealed soft tissue swelling along, no fractures or dislocation.  No radiopaque foreign bodies  Consultation: - Consulted or discussed management/test interpretation w/ external professional: None  Consideration for admission or further workup: Follow-up with hand surgery.         Final Clinical Impression(s) / ED Diagnoses Final diagnoses:  Laceration of left thumb without damage to nail, foreign body presence unspecified, initial encounter    Rx / DC Orders ED Discharge Orders          Ordered    cephALEXin (KEFLEX) 500 MG capsule  2 times daily        11/17/22 0635              Dolphus Jenny, PA-C 11/17/22 5621    Shon Baton, MD 11/17/22 2328

## 2022-11-17 NOTE — ED Provider Notes (Signed)
Patient seen in conjunction with Jalene Mullet, who is on orientation.    Patient has left thumb injury.  There is a 3 cm laceration to the posterior left thumb that appears to have mostly transected the extensor tendon.  Patient does still have good resisted strength testing.    I called and discussed the case with Payton Mccallum, PA, taking call for hand surgery, who agrees with plan to tack the skin together, splint, and have patient follow-up in office.   Roxy Horseman, PA-C 11/17/22 9528    Shon Baton, MD 11/17/22 [phone]

## 2022-11-17 NOTE — Discharge Instructions (Addendum)
Today you were treated for a laceration to your left thumb.  Please follow-up with hand surgery.  You have antibiotics at the pharmacy please take as prescribed.  Thank you for letting us treat you today. After reviewing your labs and imaging, I feel you are safe to go home. Please follow up with your PCP in the next several days and provide them with your records from this visit. Return to the Emergency Room if pain becomes severe or symptoms worsen.

## 2022-11-17 NOTE — ED Notes (Signed)
Pt verbalized understanding of discharge instructions. Pt ambulated from ed with a steady gait.

## 2022-12-23 ENCOUNTER — Ambulatory Visit (INDEPENDENT_AMBULATORY_CARE_PROVIDER_SITE_OTHER): Payer: Self-pay | Admitting: Primary Care

## 2022-12-29 ENCOUNTER — Ambulatory Visit (INDEPENDENT_AMBULATORY_CARE_PROVIDER_SITE_OTHER): Payer: Self-pay | Admitting: Primary Care

## 2023-01-06 ENCOUNTER — Ambulatory Visit (INDEPENDENT_AMBULATORY_CARE_PROVIDER_SITE_OTHER): Payer: Self-pay | Admitting: Primary Care

## 2023-01-06 ENCOUNTER — Other Ambulatory Visit (HOSPITAL_COMMUNITY)
Admission: RE | Admit: 2023-01-06 | Discharge: 2023-01-06 | Disposition: A | Discharge status: Home / Self Care | Payer: Self-pay | Source: Ambulatory Visit | Attending: Primary Care | Admitting: Primary Care

## 2023-01-06 ENCOUNTER — Encounter (INDEPENDENT_AMBULATORY_CARE_PROVIDER_SITE_OTHER): Payer: Self-pay | Admitting: Primary Care

## 2023-01-06 VITALS — BP 121/83 | HR 74 | Resp 16 | Wt 175.6 lb

## 2023-01-06 DIAGNOSIS — Z2821 Immunization not carried out because of patient refusal: Secondary | ICD-10-CM

## 2023-01-06 DIAGNOSIS — K4091 Unilateral inguinal hernia, without obstruction or gangrene, recurrent: Secondary | ICD-10-CM | POA: Insufficient documentation

## 2023-01-06 DIAGNOSIS — R300 Dysuria: Secondary | ICD-10-CM

## 2023-01-06 DIAGNOSIS — E782 Mixed hyperlipidemia: Secondary | ICD-10-CM

## 2023-01-06 NOTE — Progress Notes (Signed)
   Established Patient Office Visit  Subjective   Patient ID: Drew Levine    DOB: 17-Jan-1986  Age: 37 y.o. MRN: [id number]  Drew Levine is a 37 year old Hispanic male (interpreter Drew Levine [phone]) for f/ up on hyperlipidemia.  Hyperlipidemia This is a chronic problem. The current episode started more than 1 year ago. The problem is uncontrolled. Recent lipid tests were reviewed and are high. There are no known factors aggravating his hyperlipidemia. Current antihyperlipidemic treatment includes statins. There are no compliance problems.  There are no known risk factors for coronary artery disease.  He is concern with having intercourse he has the urge to pee which causes inability to have errection and his testicles are pain.  Patient  c/o dizziness , Nausea, and has headache fills his abdomen (stomach is bloated)   No chest pain,- No  No new weakness tingling or numbness, No Cough - shortness of breath  Active Ambulatory Problems    Diagnosis Date Noted   GERD (gastroesophageal reflux disease) 09/22/2022   Overweight (BMI 25.0-29.9) 09/22/2022   Resolved Ambulatory Problems    Diagnosis Date Noted   No Resolved Ambulatory Problems   Past Medical History:  Diagnosis Date   Hypercholesteremia      ROS  Comprehensive ROS Pertinent positive and negative noted in HPI     Objective:   BP 121/83   Pulse 74   Resp 16   Wt 175 lb 9.6 oz (79.7 kg)   SpO2 98%   BMI 28.34 kg/m    Physical Exam Constitutional:      Appearance: Normal appearance.     Comments: Over weight  HENT:     Right Ear: Tympanic membrane and ear canal normal.     Left Ear: Tympanic membrane and ear canal normal.     Nose: Nose normal.  Eyes:     Extraocular Movements: Extraocular movements intact.  Cardiovascular:     Rate and Rhythm: Normal rate and regular rhythm.  Pulmonary:     Effort: Pulmonary effort is normal.     Breath sounds: Normal breath sounds.  Abdominal:     General: Bowel  sounds are normal. There is distension.     Palpations: Abdomen is soft.  Musculoskeletal:        General: Normal range of motion.     Cervical back: Normal range of motion.  Skin:    General: Skin is warm and dry.  Neurological:     Mental Status: He is alert and oriented to person, place, and time.  Psychiatric:        Mood and Affect: Mood normal.        Behavior: Behavior normal.      No results found for any visits on 12/25/21.  The ASCVD Risk score (Arnett DK, et al., 2019) failed to calculate for the following reasons:   The systolic blood pressure is missing   Cannot find a previous HDL lab   Cannot find a previous total cholesterol lab    Assessment & Plan:   Kage was seen today for hyperlipidemia.  Diagnoses and all orders for this visit:  Influenza vaccination declined  Dysuria -     Urine cytology ancillary only  Unilateral recurrent inguinal hernia without obstruction or gangrene -     Urine cytology ancillary only  Mixed hyperlipidemia -     Lipid panel; Future     No follow-ups on file.    Grayce Sessions, NP

## 2023-01-06 NOTE — Patient Instructions (Signed)
Hernia inguinal en los adultos Inguinal Hernia, Adult Una hernia inguinal se produce cuando la grasa o los intestinos empujan a travs de una zona dbil de un msculo donde se unen la pierna y el abdomen inferior (ingle). Esto produce un bulto. Este tipo de hernia tambin puede aparecer en: En el escroto, si es varn. En los pliegues de la piel alrededor de la vagina, si es [place]. Existen tres tipos de hernias inguinales: Hernias que se pueden empujar hacia adentro del abdomen (son reducibles). Este tipo de hernias rara vez provoca dolor. Hernias que no se pueden empujar hacia adentro del abdomen (encarceladas). Hernias que no se pueden empujar hacia adentro del abdomen y pierden la irrigacin sangunea (estranguladas). Este tipo de hernia requiere ciruga de Associate Professor. Cules son las causas? Esta afeccin se produce cuando tiene una zona dbil en los msculos o en los tejidos de la ingle. Esto se desarrolla con el tiempo. La hernia puede salirse por la zona dbil cuando ejerce un esfuerzo [employer] de la parte baja del vientre de [place] repentina, por ejemplo, cuando usted: Levanta un objeto pesado. Hace esfuerzos para mover el vientre (defecar). La dificultad para mover el vientre (estreimiento) puede derivar en un esfuerzo. Tos. Qu incrementa el riesgo? Es ms probable que Dietitian en: Los hombres. [place]. Personas que: Tienen sobrepeso. Realizan trabajos que requieren Theme park manager de pie durante largos perodos o levantar cargas pesadas. Han tenido una hernia inguinal previamente. Fuman o tienen una enfermedad pulmonar. Estos factores pueden causar tos a largo plazo (crnica). Cules son los signos o sntomas? Los sntomas pueden depender del tamao de la hernia. Con frecuencia, una pequea hernia no tiene sntomas. Estos son algunos de los sntomas de una hernia ms grande: Un bulto en la zona de la ingle. Este es fcil de ver cuando se  encuentra de pie. Es posible que no pueda verlo cuando est recostado. Dolor o ardor en la ingle. Esto podra empeorar cuando levanta un objeto, realiza un esfuerzo o tose. Un dolor sordo o una sensacin de presin en la ingle. Un bulto anormal en el escroto, en los hombres. Los sntomas de una hernia inguinal estrangulada pueden incluir lo siguiente: Un bulto en la ingle que es muy doloroso y sensible al tacto. Un bulto que se torna de color rojo o prpura. Fiebre, sensacin de que va a vomitar (nuseas) y vmitos. No poder mover el vientre ni expulsar gases. Cmo se trata? El tratamiento depende del tamao de la hernia y de si tiene sntomas o no. Si no tiene sntomas, el mdico podr indicarle que controle cuidadosamente la hernia y que concurra a las visitas de seguimiento. Si tiene sntomas o una hernia grande, es posible que necesite ciruga para repararla. Siga estas instrucciones en su casa: Estilo de vida Evite levantar objetos pesados. Evite estar de pie durante mucho tiempo. No fume ni consuma ningn producto que contenga nicotina o tabaco. Si necesita ayuda para dejar de fumar, consulte al mdico. Mantenga un peso saludable. Evite la dificultad para defecar Es posible que deba tomar estas medidas para prevenir o tratar los problemas para defecar: Product manager suficiente lquido para Radio producer pis (orina) de color amarillo plido. Usar medicamentos recetados o de Sales promotion account executive. [place] alimentos ricos en fibra. Entre ellos, frijoles, cereales integrales y frutas y verduras frescas. Limitar los alimentos con alto contenido de grasa y International aid/development worker. Estos incluyen alimentos fritos o dulces. Instrucciones generales Puede intentar empujar la hernia hacia adentro presionando muy suavemente  sobre esta cuando est acostado. No intente empujar el bulto hacia adentro si este no entra fcilmente. Observe si la hernia cambia de forma, de color o de tamao. Informe al mdico si observa algn cambio. Use  los medicamentos de venta libre y los recetados solamente como se lo haya indicado el mdico. Cumpla con todas las visitas de seguimiento. Comunquese con un mdico si: Tiene fiebre o escalofros. Aparecen nuevos sntomas. Sus sntomas empeoran. Solicite ayuda de inmediato si: Tiene dolor en la ingle que empeora de repente. Tiene un bulto en la ingle que tiene las siguientes caractersticas: Se agranda de repente y no se encoge despus. Se pone rojo o morado. Le duele al tocarlo. Usted es hombre y tiene: Dolor repentino en el escroto. Un cambio repentino en el tamao del escroto. No puede volver a Electrical engineer hernia en su lugar al ejercer sobre esta una presin muy suave mientras est acostado. Siente ganas de vomitar y la sensacin no desaparece. Sigue vomitando. Tiene latidos cardacos acelerados. No puede mover el vientre o expulsar gases. Estos sntomas pueden Customer service manager. Solicite ayuda de inmediato. Comunquese con el servicio de emergencias de su localidad (911 en los [place]). No espere a ver si los sntomas desaparecen. No conduzca por sus propios medios [employer] [facility]. Resumen Una hernia inguinal se produce cuando la grasa o los intestinos empujan a travs de una zona dbil de un msculo donde se unen la pierna y el abdomen inferior (ingle). Esto produce un bulto. Si no tiene sntomas, es posible que no necesite tratamiento. Si tiene sntomas o una hernia grande, es posible que necesite [place]. Evite levantar objetos pesados. Tambin evite estar de pie durante mucho tiempo. No intente empujar el bulto hacia adentro si este no entra fcilmente. Esta informacin no tiene Theme park manager el consejo del mdico. Asegrese de hacerle al mdico cualquier pregunta que tenga. Document Revised: 12/04/2019 Document Reviewed: 10/20/2019 Elsevier Patient Education  2024 [employer].

## 2023-01-07 LAB — LIPID PANEL
Chol/HDL Ratio: 5.4 ratio — ABNORMAL HIGH (ref 0.0–5.0)
Cholesterol, Total: 166 mg/dL (ref 100–199)
HDL: 31 mg/dL — ABNORMAL LOW (ref >39)
LDL Chol Calc (NIH): 91 mg/dL (ref 0–99)
Triglycerides: 264 mg/dL — ABNORMAL HIGH (ref 0–149)
VLDL Cholesterol Cal: 44 mg/dL — ABNORMAL HIGH (ref 5–40)

## 2023-01-08 LAB — URINE CYTOLOGY ANCILLARY ONLY
Chlamydia: NEGATIVE
Comment: NEGATIVE
Comment: NEGATIVE
Comment: NORMAL
Neisseria Gonorrhea: NEGATIVE
Trichomonas: NEGATIVE

## 2023-01-10 ENCOUNTER — Encounter (INDEPENDENT_AMBULATORY_CARE_PROVIDER_SITE_OTHER): Payer: Self-pay | Admitting: Primary Care

## 2023-01-11 ENCOUNTER — Other Ambulatory Visit (INDEPENDENT_AMBULATORY_CARE_PROVIDER_SITE_OTHER): Payer: Self-pay | Admitting: Primary Care

## 2023-01-11 ENCOUNTER — Telehealth (INDEPENDENT_AMBULATORY_CARE_PROVIDER_SITE_OTHER): Payer: Self-pay

## 2023-01-11 MED ORDER — GEMFIBROZIL 600 MG PO TABS
600.0000 mg | ORAL_TABLET | Freq: Two times a day (BID) | ORAL | 1 refills | Status: DC
Start: 2023-01-11 — End: 2023-01-20

## 2023-01-11 NOTE — Telephone Encounter (Signed)
Using [employer] [phone].   Pt given lab results per notes of Marcelino Duster on 01/11/23. Pt verbalized understanding. Advised patient that and STD is not cause of his testicle pain and that his [facility] on [address].  Grayce Sessions, NP 01/11/2023  1:52 PM EST Back to Top    Your results are negative for any STDs. You do have elevated triglycerides higher than the last labs done send in gemfibrozil 600 mg take twice daily.  Healthy lifestyle diet of fruits vegetables fish nuts whole grains and low saturated fat . Foods high in cholesterol or liver, fatty meats,cheese, butter avocados, nuts and seeds, chocolate and fried foods.

## 2023-01-20 ENCOUNTER — Other Ambulatory Visit (INDEPENDENT_AMBULATORY_CARE_PROVIDER_SITE_OTHER): Payer: Self-pay | Admitting: Primary Care

## 2023-01-20 NOTE — Telephone Encounter (Signed)
Medication Refill -  Most Recent Primary Care Visit:  Provider: Grayce Sessions  Department: [facility]  Visit Type: OFFICE VISIT  Date: 01/06/2023  Medication: gemfibrozil (LOPID) 600 MG tablet   Pt stated he went to pick up the medication and the pharmacy advised him they did not have it. Please advise.   Has the patient contacted their pharmacy? Yes  (Agent: If yes, when and what did the pharmacy advise?)  Is this the correct pharmacy for this prescription? Yes If no, delete pharmacy and type the correct one.  This is the patient's preferred pharmacy:   [id number] [place], [place] - [address] AT [facility] [address] [place] [address] Phone: [phone] Fax: [phone]  Has the prescription been filled recently? Yes  Is the patient out of the medication? Yes  Has the patient been seen for an appointment in the last year OR does the patient have an upcoming appointment? Yes  Can we respond through MyChart? Yes  Agent: Please be advised that Rx refills may take up to 3 business days. We ask that you follow-up with your pharmacy.

## 2023-01-22 MED ORDER — GEMFIBROZIL 600 MG PO TABS: 600.0000 mg | ORAL_TABLET | Freq: Two times a day (BID) | ORAL | 1 refills | Status: DC

## 2023-01-22 NOTE — Telephone Encounter (Signed)
Change in pharmacy location- will send to new location Requested Prescriptions  Pending Prescriptions Disp Refills   gemfibrozil (LOPID) 600 MG tablet 180 tablet 1    Sig: Take 1 tablet (600 mg total) by mouth 2 (two) times daily before a meal.     Cardiovascular:  Antilipid - Fibric Acid Derivatives Failed - 01/20/2023 11:57 AM      Failed - Cr in normal range and within 360 days    Creatinine, Ser  Date Value Ref Range Status  05/26/2022 0.62 (L) 0.76 - 1.27 mg/dL Final         Failed - Lipid Panel in normal range within the last 12 months    Cholesterol, Total  Date Value Ref Range Status  01/06/2023 166 100 - 199 mg/dL Final   LDL Chol Calc (NIH)  Date Value Ref Range Status  01/06/2023 91 0 - 99 mg/dL Final   HDL  Date Value Ref Range Status  01/06/2023 31 (L) >39 mg/dL Final   Triglycerides  Date Value Ref Range Status  01/06/2023 264 (H) 0 - 149 mg/dL Final         Passed - ALT in normal range and within 360 days    ALT  Date Value Ref Range Status  05/26/2022 29 0 - 44 IU/L Final         Passed - AST in normal range and within 360 days    AST  Date Value Ref Range Status  05/26/2022 21 0 - 40 IU/L Final         Passed - HGB in normal range and within 360 days    Hemoglobin  Date Value Ref Range Status  05/26/2022 16.7 13.0 - 17.7 g/dL Final         Passed - HCT in normal range and within 360 days    Hematocrit  Date Value Ref Range Status  05/26/2022 48.8 37.5 - 51.0 % Final         Passed - PLT in normal range and within 360 days    Platelets  Date Value Ref Range Status  05/26/2022 321 150 - 450 x10E3/uL Final         Passed - WBC in normal range and within 360 days    WBC  Date Value Ref Range Status  05/26/2022 7.6 3.4 - 10.8 x10E3/uL Final  03/13/2018 8.6 4.0 - 10.5 K/uL Final         Passed - eGFR is 30 or above and within 360 days    GFR calc Af Amer  Date Value Ref Range Status  03/13/2018 >60 >60 mL/min Final   GFR calc non Af  Amer  Date Value Ref Range Status  03/13/2018 >60 >60 mL/min Final   eGFR  Date Value Ref Range Status  05/26/2022 128 >59 mL/min/1.73 Final         Passed - Valid encounter within last 12 months    Recent Outpatient Visits           2 weeks ago Influenza vaccination declined   [place] [facility] Grayce Sessions, NP   4 months ago Gastroesophageal reflux disease with esophagitis without hemorrhage   [place] [facility] Grayce Sessions, NP   8 months ago Annual physical exam   [place] [facility] Grayce Sessions, NP   1 year ago Heat rash    [facility] Grayce Sessions, NP   2 years ago  Alopecia   [place] [facility] Grayce Sessions, NP       Future Appointments             In 5 months Randa Evens, Kinnie Scales, NP [facility]

## 2023-02-04 ENCOUNTER — Ambulatory Visit (INDEPENDENT_AMBULATORY_CARE_PROVIDER_SITE_OTHER): Payer: Self-pay

## 2023-02-04 NOTE — Telephone Encounter (Signed)
The patient called in stating he has been having penile discharge and itching for over 3 days. He is having issues with some discharge and he says finishing too fast during intercourse which is really bothering him. Please assist patient further as his provider does not have any available appts for at least a week.   Using Spanish interpreter Jesus # [phone]. Chief Complaint: Penile discharge,itching and early ejaculation. Asking to be worked in. Symptoms: Above Frequency: 3 days Pertinent Negatives: Patient denies urinary symptoms Disposition: [] ED /[] Urgent Care (no appt availability in office) / [] Appointment(In office/virtual)/ []  [place] [facility]/ [] Home Care/ [] Refused Recommended Disposition /[] [place] [facility]/ [x]  Follow-up with PCP Additional Notes: No availability, please advise.  Reason for Disposition  Severe itching  Answer Assessment - Initial Assessment Questions 1. SYMPTOM: "What's the main symptom you're concerned about?" (e.g., discharge from penis, rash, pain, itching, swelling)     Discharge, itching 2. LOCATION: "Where is the  located?"     Penis 3. ONSET: "When did   start?"     3 days ago  4. PAIN: "Is there any pain?" If Yes, ask: "How bad is it?"  (Scale 1-10; or mild, moderate, severe)     No 5. URINE: "Any difficulty passing urine?" If Yes, ask: "When was the last time?"     No 6. CAUSE: "What do you think is causing the symptoms?"     Unsure 7. OTHER SYMPTOMS: "Do you have any other symptoms?" (e.g., fever, abdomen pain, blood in urine)     Early ejaculation  Protocols used: Penis and Scrotum Symptoms-A-AH

## 2023-02-11 ENCOUNTER — Ambulatory Visit (INDEPENDENT_AMBULATORY_CARE_PROVIDER_SITE_OTHER): Payer: Self-pay | Admitting: Primary Care

## 2023-02-11 DIAGNOSIS — Z113 Encounter for screening for infections with a predominantly sexual mode of transmission: Secondary | ICD-10-CM

## 2023-03-03 ENCOUNTER — Ambulatory Visit: Payer: Self-pay | Admitting: Physician Assistant

## 2023-03-03 ENCOUNTER — Ambulatory Visit (INDEPENDENT_AMBULATORY_CARE_PROVIDER_SITE_OTHER): Payer: Self-pay | Admitting: *Deleted

## 2023-03-03 ENCOUNTER — Telehealth (INDEPENDENT_AMBULATORY_CARE_PROVIDER_SITE_OTHER): Payer: Self-pay | Admitting: Primary Care

## 2023-03-03 ENCOUNTER — Encounter: Payer: Self-pay | Admitting: Physician Assistant

## 2023-03-03 VITALS — BP 116/81 | HR 87 | Ht 65.0 in | Wt 174.0 lb

## 2023-03-03 DIAGNOSIS — R519 Headache, unspecified: Secondary | ICD-10-CM

## 2023-03-03 DIAGNOSIS — L659 Nonscarring hair loss, unspecified: Secondary | ICD-10-CM

## 2023-03-03 DIAGNOSIS — R42 Dizziness and giddiness: Secondary | ICD-10-CM

## 2023-03-03 DIAGNOSIS — F524 Premature ejaculation: Secondary | ICD-10-CM

## 2023-03-03 MED ORDER — PAROXETINE HCL 10 MG PO TABS
10.0000 mg | ORAL_TABLET | Freq: Every day | ORAL | 1 refills | Status: AC
Start: 2023-03-03 — End: ?

## 2023-03-03 MED ORDER — KETOCONAZOLE 2 % EX CREA: 1.0000 | TOPICAL_CREAM | Freq: Every day | CUTANEOUS | 1 refills | Status: AC

## 2023-03-03 NOTE — Telephone Encounter (Signed)
 Called to schedule apt with pt. Pt hung up on me. Please advise.

## 2023-03-03 NOTE — Patient Instructions (Addendum)
 You will use ketoconzole for your hair loss, you will apply it once a day  You will start paxil  10 mg once a day to help with your intercourse difficulties  To help with your headaches, I do encourage you to have a vision exam.  To help with your dizziness, I do encourage you to decrease your water intake, you should aim between 64 and 80 ounces a day.  We will call with todays labs results  Malcom Scriver, PA-C Physician Assistant [facility] [email or web address]  Disfuncin erctil Erectile Dysfunction La disfuncin erctil (DE) es la incapacidad de lograr o mantener una ereccin para Management consultant. La DE se considera un sntoma de un trastorno subyacente y no una enfermedad. La DE puede incluir lo siguiente: Imposibilidad de Presenter, broadcasting. Falta de ereccin suficiente en el pene para permitir la penetracin. Prdida de la ereccin antes de Corporate investment banker. Cules son las causas? Esta afeccin puede ser causada por lo siguiente: Causas fsicas como: Problemas arteriales. Esto puede incluir enfermedad cardaca, presin arterial alta, aterosclerosis y diabetes. Problemas hormonales como en los casos de bajo nivel de [place]. Obesidad. Problemas neurolgicos. Se incluyen lesiones en la espalda o la pelvis, esclerosis mltiple, enfermedad de Parkinson, lesin en la mdula espinal y accidente cerebrovascular. Ciertos medicamentos, como los siguientes: Analgsicos. Antidepresivos. Medicamentos para la presin arterial y diurticos. Medicamentos para Management consultant. Antihistamnicos. Relajantes musculares. Factores en el estilo de vida como: Consumo de drogas, como marihuana, cocana u opioides. Consumo excesivo de alcohol. Fumar. Falta de actividad fsica o de ejercicio. Causas psicolgicas como: Ansiedad o estrs. Tristeza o depresin. Agotamiento. [place] al rendimiento sexual. Culpa. Cules son los  signos o sntomas? Los sntomas de esta afeccin incluyen: Imposibilidad de Presenter, broadcasting. Falta de ereccin suficiente en el pene para permitir la penetracin. Falta de ereccin antes de Corporate investment banker. A veces puede haber erecciones normales, pero con episodios insatisfactorios frecuentes. [place] satisfaccin sexual en ambos miembros de la pareja debido a los problemas erctiles. Con la ereccin el pene puede curvarse. La curvatura del pene puede provocar dolor o puede ser demasiado pronunciada como para permitir el coito. No tener nunca erecciones nocturnas o matutinas. Cmo se diagnostica? Esta afeccin frecuentemente se diagnostica mediante: Un examen fsico para descartar otras enfermedades o problemas especficos en el pene. Preguntas detalladas acerca del problema. Pruebas, como, por ejemplo: Anlisis de sangre para descartar diabetes mellitus o niveles altos de colesterol, o para medir los niveles de hormonas. Otros anlisis para Hotel manager subyacentes. Nestor Banter para descartar cicatrices. Una prueba para examinar el flujo sanguneo en el pene. Estudio del sueo para medir las erecciones nocturnas. Cmo se trata? El tratamiento para esta afeccin puede incluir lo siguiente: Medicamentos, como por ejemplo: Medicamentos que se toman por boca para ayudar a Transport planner ereccin (medicamentos orales). Terapia de reemplazo hormonal para reemplazar los bajos niveles de [place]. Medicamentos que se inyectan en el pene. El mdico puede ensearle a aplicarse estas inyecciones en su casa. Medicamentos que se administran con un tubo Environmental education officer. El tubo se inserta en la abertura que se encuentra en la punta del pene, que es la abertura de la uretra. Se coloca un diminuto grnulo de medicamento en la uretra. El grnulo se disuelve y mejora la funcin erctil. Esto tambin se denomina terapia MUSE (sistema uretral medicado para erecciones). Bomba de vaco. Esta es  una bomba con un anillo. La bomba y el anillo se colocan en el pene y se  usan para crear una presin que ayuda a que el pene se ponga erecto. Implantacin quirrgica de una prtesis de pene. En este procedimiento, puede recibir [employer] siguientes tipos de prtesis: Implante inflable. Consiste en cilindros, una bomba y un depsito. Los cilindros pueden inflarse con un lquido que ayuda a crear Chartered loss adjuster, y puede desinflarse despus del coito. Implante semirrgido. Esto [employer] varas de goma con silicona. Las varas proporcionan algo de rigidez. Tambin son flexibles, por lo que el pene puede curvarse hacia abajo en su posicin normal y volverse erecto para Management consultant. Ciruga de vasos sanguneos para mejorar el flujo de sangre al pene. Durante este procedimiento, se coloca un vaso sanguneo de una parte diferente del cuerpo dentro del pene para permitir que la sangre fluya alrededor (derivacin o bypass) de los vasos sanguneos daados u obstruidos. Cambios en el estilo de vida, como hacer ms ejercicio, perder peso y dejar de fumar. Siga estas instrucciones en su casa: Medicamentos  Use los medicamentos de venta libre y los recetados solamente como se lo haya indicado el mdico. No aumente la dosis sin analizarlo primero con su mdico. Si est usando autoinyecciones, aplqueselas segn las indicaciones de su mdico. Asegrese de evitar las venas que estn en la superficie del pene. Despus de aplicar una inyeccin, ejerza presin en el sitio de la inyeccin durante 5 minutos. Hable con el mdico sobre cmo prevenir los dolores de cabeza mientras toma medicamentos para la DE. Estos medicamentos pueden causar un dolor de cabeza repentino debido al aumento del flujo sanguneo en el cuerpo. Instrucciones generales Haga ejercicio regularmente como se lo haya indicado el mdico. Si debe perder peso, hable con su mdico. No consuma ningn producto que contenga nicotina o tabaco.  Estos productos incluyen cigarrillos, tabaco para mascar y aparatos de vapeo, como los cigarrillos electrnicos. Si necesita ayuda para dejar de fumar, consulte al [employer]. Antes de usar una bomba de vaco, lea las instrucciones que vienen con la bomba y hgale a su mdico cualquier pregunta que tenga. Concurra a todas las visitas de seguimiento. Esto es importante. Comunquese con un mdico si: Siente nuseas. Tiene vmitos. Tiene dolores de cabeza repentinos mientras toma medicamentos para la DE. Tiene inquietudes relacionadas con su salud sexual. Solicite ayuda de inmediato si: Se administra medicamentos orales o inyectables y tiene una ereccin que dura ms de 4 horas. Si su mdico no est disponible, concurra al servicio de emergencias ms cercano para realizar una evaluacin. Una ereccin que dure ms de 4 horas puede dar como resultado un dao permanente en el pene. Siente un dolor intenso en la ingle o el abdomen. Presenta un enrojecimiento o hinchazn grave en el pene. Tiene una zona colorada que se extiende en la ingle o la zona baja del abdomen. No puede orinar. Tiene dolor en el pecho o un latido cardaco rpido (palpitaciones) despus de tomar los [employer]. Estos sntomas pueden representar un problema grave que constituye Radio broadcast assistant. No espere a ver si los sntomas desaparecen. Solicite atencin mdica de inmediato. Comunquese con el servicio de emergencias de su localidad (911 en los [place]). No conduzca por sus propios medios [employer] hospital. Resumen La disfuncin erctil (DE) es la incapacidad de lograr o Pharmacologist una ereccin durante las relaciones sexuales. Esta afeccin se diagnostica en funcin de un examen fsico, los sntomas y anlisis para Production assistant, radio causa. El tratamiento vara segn la causa, y puede incluir medicamentos, terapia hormonal, ciruga o bomba de vaco.  Es posible que deba realizar visitas de seguimiento para asegurarse de que est  usando sus medicamentos o dispositivos correctamente. Obtenga ayuda de inmediato si se administra medicamentos orales o inyectables y tiene una ereccin que dura ms de 4 horas. Esta informacin no tiene Theme park manager el consejo del mdico. Asegrese de hacerle al mdico cualquier pregunta que tenga. Document Revised: 05/25/2020 Document Reviewed: 05/25/2020 Elsevier Patient Education  2024 [employer].

## 2023-03-03 NOTE — Telephone Encounter (Signed)
 [id number] Reason for Disposition . Wants doctor to measure BP  Answer Assessment - Initial Assessment Questions 1. BLOOD PRESSURE: "What is the blood pressure?" "Did you take at least two measurements 5 minutes apart?"     Unable to give measurement 2. ONSET: "When did you take your blood pressure?"     ongoing  4. HISTORY: "Do you have a history of high blood pressure?"     no  6. OTHER SYMPTOMS: "Do you have any symptoms?" (e.g., blurred vision, chest pain, difficulty breathing, headache, weakness)     Anxious, nervous, burning in stomach, depression- decreased sex drive  Protocols used: Blood Pressure - High-A-AH

## 2023-03-03 NOTE — Progress Notes (Signed)
Established Patient Office Visit  Subjective   Patient ID: Drew Levine, male    DOB: 1985-12-25  Age: 38 y.o. MRN: [id number]  Chief Complaint  Patient presents with   Alopecia    Patient states he has mentioned this to his provider, but nothing has been done. He states he started with hair loss, when his headaches had worsen    severe headaches    Pt states headaches come in intervals and come frequently throughout the day.    Dizziness   Discussed the use of AI scribe software for clinical note transcription with the patient, who gave verbal consent to proceed.  History of Present Illness         The patient presents with concerns about worsening hair loss. The hair loss, characterized by patchy baldness, has been ongoing for approximately two years. The patient attempted self-treatment with a shampoo, but it was ineffective. They deny any associated itchiness or rashes elsewhere on the body.  The headaches, described as transient and lasting approximately a minute, occur three to four times daily. However, there are periods of two to three days without any headaches. The patient also reports intermittent dizziness, occurring once or twice daily, lasting only seconds. They describe a sensation of imbalance, feeling as though they might fall, particularly when standing.  The patient also reports a six-month history of anxiety during sexual intercourse, often resulting in a strong urge to urinate. Despite attempts to urinate prior to intercourse, the issue persists, and they are unable to complete the act.  The patient maintains a high level of hydration, consuming approximately a gallon of water over eight hours daily. They deny any significant screen time and have not had their vision checked recently. Sleep quality is reported as good.  Due to language barrier, an interpreter was present during the history-taking and subsequent discussion (and for part of the physical exam)  with this patient.     Past Medical History:  Diagnosis Date   Hypercholesteremia    Social History   Tobacco Use   Smoking status: Never   Smokeless tobacco: Never  Substance Use Topics   Alcohol use: No   Drug use: No   Family History  Problem Relation Age of Onset   Healthy Mother    Healthy Father    No Known Allergies  Review of Systems  Constitutional: Negative.   HENT: Negative.    Eyes: Negative.   Respiratory:  Negative for shortness of breath.   Cardiovascular:  Negative for chest pain.  Gastrointestinal: Negative.   Genitourinary: Negative.   Musculoskeletal: Negative.   Skin: Negative.   Neurological:  Positive for dizziness and headaches.  Endo/Heme/Allergies: Negative.   Psychiatric/Behavioral: Negative.        Objective:     BP 116/81 (BP Location: Left Arm, Patient Position: Sitting, Cuff Size: Large)   Pulse 87   Ht 5\' 5"  (1.651 m)   Wt 174 lb (78.9 kg)   SpO2 98%   BMI 28.96 kg/m  BP Readings from Last 3 Encounters:  03/03/23 116/81  01/06/23 121/83  11/17/22 122/78   Wt Readings from Last 3 Encounters:  03/03/23 174 lb (78.9 kg)  01/06/23 175 lb 9.6 oz (79.7 kg)  11/16/22 171 lb 15.3 oz (78 kg)    Physical Exam Vitals and nursing note reviewed.  Constitutional:      Appearance: Normal appearance.  HENT:     Head: Normocephalic and atraumatic.     Right Ear: External  ear normal.     Left Ear: External ear normal.     Nose: Nose normal.     Mouth/Throat:     Mouth: Mucous membranes are moist.     Pharynx: Oropharynx is clear.  Eyes:     Extraocular Movements: Extraocular movements intact.     Conjunctiva/sclera: Conjunctivae normal.     Pupils: Pupils are equal, round, and reactive to light.  Cardiovascular:     Rate and Rhythm: Normal rate and regular rhythm.     Pulses: Normal pulses.     Heart sounds: Normal heart sounds.  Pulmonary:     Effort: Pulmonary effort is normal.     Breath sounds: Normal breath sounds.   Musculoskeletal:        General: Normal range of motion.     Cervical back: Normal range of motion and neck supple.  Skin:    General: Skin is warm and dry.     Comments: See photos   Neurological:     General: No focal deficit present.     Mental Status: He is alert and oriented to person, place, and time.  Psychiatric:        Mood and Affect: Mood normal.        Behavior: Behavior normal.        Thought Content: Thought content normal.        Judgment: Judgment normal.           Assessment & Plan:   Problem List Items Addressed This Visit   None Visit Diagnoses       Patchy loss of hair    -  Primary   Relevant Medications   ketoconazole (NIZORAL) 2 % cream   Other Relevant Orders   TSH (Completed)     Dizziness and giddiness       Relevant Orders   Comp. Metabolic Panel (12) (Completed)   Vitamin D, 25-hydroxy (Completed)     Frequent headaches       Relevant Medications   PARoxetine (PAXIL) 10 MG tablet     Premature ejaculation       Relevant Medications   PARoxetine (PAXIL) 10 MG tablet      1. Patchy loss of hair (Primary) Trial ketoconazole.  Patient education given on supportive care.  Red flags given for prompt reevaluation  Patient to follow-up with mobile unit in 4 weeks. - TSH - ketoconazole (NIZORAL) 2 % cream; Apply 1 Application topically daily.  Dispense: 30 g; Refill: 1  2. Dizziness and giddiness Encouraged patient to decrease hydration, monitor blood pressure during episodes of dizziness. - Comp. Metabolic Panel (12) - Vitamin D, 25-hydroxy  3. Frequent headaches Patient education given on supportive care.  Encouraged patient to schedule vision exam.  4. Premature ejaculation Trial Paxil.  Patient education given on supportive care - PARoxetine (PAXIL) 10 MG tablet; Take 1 tablet (10 mg total) by mouth daily.  Dispense: 30 tablet; Refill: 1   I have reviewed the patient's medical history (PMH, PSH, Social History, Family History,  Medications, and allergies) , and have been updated if relevant. I spent 30 minutes reviewing chart and  face to face time with patient.    Return in about 4 weeks (around 03/31/2023) for With [facility].    Kasandra Knudsen Mayers, PA-C

## 2023-03-03 NOTE — Telephone Encounter (Signed)
  Chief Complaint: patient is concerned that his BP is high- increased anxiety, burning in stomach Symptoms: Patient states he has multiple symptoms- anxiety, stomach burning, hair loss- he is unable to give BP reading- last office reading was normal Frequency: ongoing Pertinent Negatives: Patient denies chest pain, difficulty breathing Disposition: [] ED /[] Urgent Care (no appt availability in office) / [] Appointment(In office/virtual)/ []  [facility]/ [] Home Care/ [] Refused Recommended Disposition /[] [facility]/ [x]  Follow-up with PCP Additional Notes: Patient advised no open appointment in office- patient is off work on Wednesday and is requesting Wednesday appointment. Advised MU/UC- but patient wants appointment in office. Patient advised will send request since Wednesday is only available day for him.

## 2023-03-04 ENCOUNTER — Encounter: Payer: Self-pay | Admitting: Physician Assistant

## 2023-03-04 LAB — VITAMIN D 25 HYDROXY (VIT D DEFICIENCY, FRACTURES): Vit D, 25-Hydroxy: 20.5 ng/mL — ABNORMAL LOW (ref 30.0–100.0)

## 2023-03-04 LAB — COMP. METABOLIC PANEL (12)
AST: 28 [IU]/L (ref 0–40)
Albumin: 4.6 g/dL (ref 4.1–5.1)
Alkaline Phosphatase: 80 [IU]/L (ref 44–121)
BUN/Creatinine Ratio: 18 (ref 9–20)
BUN: 12 mg/dL (ref 6–20)
Bilirubin Total: 0.3 mg/dL (ref 0.0–1.2)
Calcium: 9.4 mg/dL (ref 8.7–10.2)
Chloride: 101 mmol/L (ref 96–106)
Creatinine, Ser: 0.68 mg/dL — ABNORMAL LOW (ref 0.76–1.27)
Globulin, Total: 2.9 g/dL (ref 1.5–4.5)
Glucose: 101 mg/dL — ABNORMAL HIGH (ref 70–99)
Potassium: 4 mmol/L (ref 3.5–5.2)
Sodium: 139 mmol/L (ref 134–144)
Total Protein: 7.5 g/dL (ref 6.0–8.5)
eGFR: 124 mL/min/{1.73_m2} (ref >59)

## 2023-03-04 LAB — TSH: TSH: 2.13 u[IU]/mL (ref 0.450–4.500)

## 2023-07-07 ENCOUNTER — Telehealth (INDEPENDENT_AMBULATORY_CARE_PROVIDER_SITE_OTHER): Payer: Self-pay | Admitting: Primary Care

## 2023-07-07 ENCOUNTER — Ambulatory Visit (INDEPENDENT_AMBULATORY_CARE_PROVIDER_SITE_OTHER): Payer: Self-pay | Admitting: Primary Care

## 2023-07-07 NOTE — Telephone Encounter (Signed)
 Called pt to reschedule missed appt. Pt did not answer and LVM.

## 2023-09-13 ENCOUNTER — Ambulatory Visit (INDEPENDENT_AMBULATORY_CARE_PROVIDER_SITE_OTHER): Payer: Self-pay | Admitting: Primary Care

## 2023-09-13 ENCOUNTER — Other Ambulatory Visit: Payer: Self-pay

## 2023-09-13 ENCOUNTER — Encounter (INDEPENDENT_AMBULATORY_CARE_PROVIDER_SITE_OTHER): Payer: Self-pay | Admitting: Primary Care

## 2023-09-13 VITALS — BP 129/81 | HR 74 | Resp 16 | Wt 164.4 lb

## 2023-09-13 DIAGNOSIS — E782 Mixed hyperlipidemia: Secondary | ICD-10-CM

## 2023-09-13 DIAGNOSIS — N529 Male erectile dysfunction, unspecified: Secondary | ICD-10-CM

## 2023-09-13 DIAGNOSIS — Z0184 Encounter for antibody response examination: Secondary | ICD-10-CM

## 2023-09-13 DIAGNOSIS — Z6827 Body mass index (BMI) 27.0-27.9, adult: Secondary | ICD-10-CM

## 2023-09-13 DIAGNOSIS — E559 Vitamin D deficiency, unspecified: Secondary | ICD-10-CM

## 2023-09-13 DIAGNOSIS — E663 Overweight: Secondary | ICD-10-CM

## 2023-09-13 MED ORDER — ERGOCALCIFEROL 1.25 MG (50000 UT) PO CAPS
50000.0000 [IU] | ORAL_CAPSULE | ORAL | 0 refills | Status: AC
Start: 2023-09-13 — End: ?
  Filled 2023-09-13: qty 8, 56d supply, fill #0

## 2023-09-13 MED ORDER — SILDENAFIL CITRATE 100 MG PO TABS
100.0000 mg | ORAL_TABLET | Freq: Every day | ORAL | 0 refills | Status: AC | PRN
Start: 2023-09-13 — End: ?
  Filled 2023-09-13: qty 10, 10d supply, fill #0

## 2023-09-13 NOTE — Patient Instructions (Signed)
 Sildenafil  Tablets (Erectile Dysfunction) Qu es este medicamento? El SILDENAFILO trata la disfuncin erctil. Acta aumentando el flujo sanguneo al pene y esto ayuda a Pharmacologist una ereccin. Este medicamento puede ser utilizado para otros usos; si tiene alguna pregunta consulte con su proveedor de atencin mdica o con su farmacutico. MARCAS COMUNES: Viagra  Qu le debo informar a mi profesional de la salud antes de tomar este medicamento? Necesitan saber si usted presenta alguno de los siguientes problemas o situaciones: Forma anormal del pene o enfermedad de Peyronie Trastornos de sangrado Problemas en los ojos o prdida de visin Enfermedad cardiaca Presin arterial baja Antecedentes de enfermedades de la sangre, tales como anemia falciforme o leucemia Antecedentes de ereccin dolorosa y prolongada Enfermedad venooclusiva pulmonar (EVOP) lceras estomacales u otros problemas estomacales o intestinales Una reaccin alrgica o inusual al sildenafilo, a otros medicamentos, alimentos, colorantes o conservantes Si est embarazada o buscando quedar embarazada Si est amamantando a un beb Cmo debo utilizar este medicamento? Tome este medicamento por va oral con un vaso de agua. Siga las instrucciones de la etiqueta del [place]. La dosis por lo general se toma 1 hora antes de la actividad sexual. No debe tomar la dosis ms de Magazine features editor. No use su medicamento con una frecuencia mayor a la indicada. Hable con su equipo de atencin sobre el uso de este medicamento en nios. Este medicamento no se usa  en nios para esta afeccin. Sobredosis: Pngase en contacto inmediatamente con un centro toxicolgico o una sala de urgencia si usted cree que haya tomado demasiado medicamento.<br>ATENCIN: [employer] es solo para usted. No comparta este medicamento con nadie. Qu sucede si me olvido de una dosis? No se aplica en este caso. Qu puede interactuar con este medicamento? No use  este medicamento con ninguno de los siguientes productos: Medical laboratory scientific officer, tales como nitrito de amilo, dinitrato de isosorbida, mononitrato de isosorbida, nitroglicerina Riociguat Vericiguat Este medicamento tambin podra interactuar con los siguientes productos: [place] Ciertos medicamentos para la presin arterial Otros medicamentos podran afectar la forma en que funciona este medicamento. Hable con su equipo de atencin [employer] medicamentos que usa . Es posible que le sugieran cambios en su plan de tratamiento para reducir el riesgo de efectos secundarios y asegurarse de que los medicamentos actan del modo previsto. Puede ser que esta lista no menciona todas las posibles interacciones. Informe a su profesional de [employer] de [employer] productos a base de hierbas, medicamentos de [employer] o suplementos nutritivos que est tomando. Si usted fuma, consume bebidas alcohlicas o si utiliza drogas ilegales, indqueselo tambin a su profesional de [employer]. Algunas sustancias pueden interactuar con su medicamento. A qu debo estar atento al usar [employer]? Visite a su equipo de atencin para que revise su evolucin peridicamente. Informe a su equipo de atencin si los sntomas no comienzan a mejorar o si empeoran. Si observa algn cambio en la vista o la audicin, infrmelo de inmediato a su equipo de atencin. Este medicamento podra afectar su coordinacin, tiempo de reaccin o juicio. No conduzca ni opere maquinaria pesada hasta que sepa cmo le afecta este medicamento. Pngase de pie o levntese lentamente para reducir el riesgo de mareos o [place]. Beber alcohol con [employer] puede aumentar el riesgo de estos efectos secundarios. Si tiene una ereccin que dura ms de 4 horas o si se vuelve dolorosa, comunquese con su equipo de atencin inmediatamente. Esto puede ser una seal de un problema serio y debe tratarse inmediatamente para prevenir dao  permanente. Si tiene sntomas  de nuseas, Research scientist (life sciences), Engineer, mining en el pecho o dolor en un brazo una vez que inicia la actividad sexual, despus de usar este medicamento, no debe continuar con la actividad sexual y Chief Strategy Officer a su equipo de atencin tan pronto como sea posible. Usar [employer] no los protege ni a usted ni a su pareja de la infeccin por VIH ni de ninguna otra infeccin de transmisin sexual. Qu efectos secundarios puedo tener al utilizar este medicamento? Efectos secundarios que debe informar a su equipo de atencin tan pronto como sea posible: Reacciones alrgicas: erupcin cutnea, comezn/picazn, urticaria, hinchazn de la cara, los labios, la lengua o la garganta Prdida de audicin o zumbido en los odos Ataque cardiaco: Engineer, mining u opresin en el pecho, los hombros, los brazos o la [place], nuseas, falta de [place], piel fra o sudorosa, sensacin de desmayo o aturdimiento Cambios en el ritmo cardiaco: frecuencia cardiaca rpida o irregular, mareos, sensacin de desmayo o aturdimiento, Journalist, newspaper, dificultad para respirar Presin arterial baja: mareo, sensacin de desmayo o aturdimiento, visin borrosa Falta de aire que empeora o aparece por primera vez Ereccin prolongada o dolorosa Accidente cerebrovascular: entumecimiento o debilidad repentinos de la cara, un brazo o una pierna, dificultad para hablar, confusin, dificultad para caminar, prdida de equilibrio o coordinacin, mareos, dolor de cabeza intenso, cambio en la visin Prdida repentina de la visin en uno o ambos ojos Efectos secundarios que generalmente no requieren atencin mdica (debe informarlos a su equipo de atencin si persisten o si son molestos): Enrojecimiento facial Dolor de cabeza Sangrado por la nariz [place] o congestin nasal Dificultad para dormir Magazine features editor ser que esta lista no menciona todos los posibles efectos secundarios. Comunquese a su mdico por asesoramiento mdico [employer].  Usted puede informar los efectos secundarios a la FDA por telfono al 1-800-FDA-1088. Dnde debo guardar mi medicina? Mantenga fuera del alcance de nios y Neurosurgeon. Guarde a [employer], entre 15 y 30 grados Celsius (59 y 71 grados Fahrenheit). Deseche todo el medicamento que no haya utilizado despus de la fecha de vencimiento. ATENCIN: Este folleto es un resumen. Puede ser que no cubra toda la posible informacin. Si usted tiene preguntas acerca de esta medicina, consulte con su mdico, su farmacutico o su profesional de Radiographer, therapeutic.  2024 Elsevier/Gold Standard (2022-06-29 00:00:00)

## 2023-09-13 NOTE — Progress Notes (Signed)
 [facility]  Drew Levine, is a 38 y.o. 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  Miscommunication he denies any testicular pain.  The problem is he is able to have an erection the problem is some staining it only lasts around a few seconds.  This has been a problem for the last 2 months.  This has caused him anxiety due to frustration with trying to have intimacy with his wife. He also has a history of hyperlipidemia and will have labs done today. Blood pressure is 129/81 this is the highest blood pressure he has had secondary to the discussion he is having with his male PCP.  No problems updated.  Comprehensive ROS Pertinent positive and negative noted in HPI   No Known Allergies  Past Medical History:  Diagnosis Date   Hypercholesteremia     Current Outpatient Medications on File Prior to Visit  Medication Sig Dispense Refill   cephALEXin  (KEFLEX ) 500 MG capsule Take 1 capsule (500 mg total) by mouth 2 (two) times daily. (Patient not taking: Reported on 03/03/2023) 14 capsule 0   gemfibrozil  (LOPID ) 600 MG tablet Take 1 tablet (600 mg total) by mouth 2 (two) times daily before a meal. 180 tablet 1   ketoconazole  (NIZORAL ) 2 % cream Apply 1 Application topically daily. 30 g 1   omeprazole  (PRILOSEC) 40 MG capsule TOME UNA CAPSULA TODOS LOS DIAS (Patient not taking: Reported on 03/03/2023) 30 capsule 2   PARoxetine  (PAXIL ) 10 MG tablet Take 1 tablet (10 mg total) by mouth daily. 30 tablet 1   No current facility-administered medications on file prior to visit.    Health Maintenance  Topic Date Due   Hepatitis B Vaccine (1 of 3 - 19+ 3-dose series) Never done   HPV Vaccine (1 - 3-dose SCDM series) Never done   COVID-19 Vaccine (1 - 2024-25 season) Never done   Flu Shot  09/17/2023   DTaP/Tdap/Td vaccine (2 - Td or Tdap) 08/19/2025   Hepatitis C Screening  Completed   HIV Screening  Completed   Meningitis B Vaccine  Aged Out    Objective:   Vitals:   09/13/23 1530  BP: 129/81  Pulse: 74  Resp: 16  SpO2: 97%  Weight: 164 lb 6.4 oz (74.6 kg)   BP Readings from Last 3 Encounters:  09/13/23 129/81  03/03/23 116/81  01/06/23 121/83      Physical Exam Vitals reviewed.  Constitutional:      Appearance: Normal appearance. He is obese.  HENT:     Head: Normocephalic.     Right Ear: Tympanic membrane, ear canal and external ear normal.     Left Ear: Tympanic membrane, ear canal and external ear normal.     Nose: Nose normal.  Eyes:     Extraocular Movements: Extraocular movements intact.     Pupils: Pupils are equal, round, and reactive to light.  Cardiovascular:     Rate and Rhythm: Normal rate and regular rhythm.  Pulmonary:  Effort: Pulmonary effort is normal.     Breath sounds: Normal breath sounds.  Abdominal:     General: Bowel sounds are normal. There is distension.     Palpations: Abdomen is soft.  Musculoskeletal:        General: Normal range of motion.     Cervical back: Normal range of motion.  Skin:    General: Skin is warm and dry.  Neurological:     Mental Status: He is alert and oriented to person, place, and time.  Psychiatric:        Mood and Affect: Mood normal.        Behavior: Behavior normal.        Thought Content: Thought content normal.        Judgment: Judgment normal.      Assessment & Plan  Mukund was seen today for hyperlipidemia and testicle pain.  Diagnoses and all orders for this visit:  Mixed hyperlipidemia -     CMP14+EGFR -     Lipid panel  Immunity status testing -      Hepatitis B surface antibody,qualitative  Erectile dysfunction, unspecified erectile dysfunction type -     sildenafil  (VIAGRA ) 100 MG tablet; Take 1 tablet (100 mg total) by mouth daily as needed for erectile dysfunction.  Overweight (BMI 25.0-29.9) Discussed diet and exercise for person with BMI >25. Instructed: You must burn more calories than you eat. Losing 5 percent of your body weight should be considered a success. In the longer term, losing more than 15 percent of your body weight and staying at this weight is an extremely good result. However, keep in mind that even losing 5 percent of your body weight leads to important health benefits, so try not to get discouraged if you're not able to lose more than this. Will recheck weight in 3-6 months.   Vitamin D  deficiency -     VITAMIN D  25 Hydroxy (Vit-D Deficiency, Fractures) -     ergocalciferol  (VITAMIN D2) 1.25 MG (50000 UT) capsule; Take 1 capsule (50,000 Units total) by mouth once a week.    Patient have been counseled extensively about nutrition and exercise. Other issues discussed during this visit include: low cholesterol diet, weight control and daily exercise, foot care, annual eye examinations at Ophthalmology, importance of adherence with medications and regular follow-up. We also discussed long term complications of uncontrolled diabetes and hypertension.   Return in about 3 months (around 12/14/2023) for fasting labs.  The patient was given clear instructions to go to ER or return to medical center if symptoms don't improve, worsen or new problems develop. The patient verbalized understanding. The patient was told to call to get lab results if they haven't heard anything in the next week.   This note has been created with Education officer, environmental. Any transcriptional errors are unintentional.   Drew SHAUNNA Bohr, NP 09/13/2023, 4:20 PM

## 2023-09-14 ENCOUNTER — Other Ambulatory Visit: Payer: Self-pay

## 2023-09-14 LAB — CMP14+EGFR
ALT: 23 IU/L (ref 0–44)
AST: 24 IU/L (ref 0–40)
Albumin: 4.7 g/dL (ref 4.1–5.1)
Alkaline Phosphatase: 86 IU/L (ref 44–121)
BUN/Creatinine Ratio: 21 — ABNORMAL HIGH (ref 9–20)
BUN: 16 mg/dL (ref 6–20)
Bilirubin Total: 0.3 mg/dL (ref 0.0–1.2)
CO2: 23 mmol/L (ref 20–29)
Calcium: 9.7 mg/dL (ref 8.7–10.2)
Chloride: 103 mmol/L (ref 96–106)
Creatinine, Ser: 0.76 mg/dL (ref 0.76–1.27)
Globulin, Total: 2.8 g/dL (ref 1.5–4.5)
Glucose: 96 mg/dL (ref 70–99)
Potassium: 4.3 mmol/L (ref 3.5–5.2)
Sodium: 141 mmol/L (ref 134–144)
Total Protein: 7.5 g/dL (ref 6.0–8.5)
eGFR: 119 mL/min/1.73 (ref >59)

## 2023-09-14 LAB — LIPID PANEL
Chol/HDL Ratio: 4.4 ratio (ref 0.0–5.0)
Cholesterol, Total: 168 mg/dL (ref 100–199)
HDL: 38 mg/dL — ABNORMAL LOW (ref >39)
LDL Chol Calc (NIH): 99 mg/dL (ref 0–99)
Triglycerides: 177 mg/dL — ABNORMAL HIGH (ref 0–149)
VLDL Cholesterol Cal: 31 mg/dL (ref 5–40)

## 2023-09-14 LAB — HEPATITIS B SURFACE ANTIBODY,QUALITATIVE: Hep B Surface Ab, Qual: NONREACTIVE

## 2023-09-14 LAB — VITAMIN D 25 HYDROXY (VIT D DEFICIENCY, FRACTURES): Vit D, 25-Hydroxy: 31.6 ng/mL (ref 30.0–100.0)

## 2023-09-20 ENCOUNTER — Ambulatory Visit (INDEPENDENT_AMBULATORY_CARE_PROVIDER_SITE_OTHER): Payer: Self-pay | Admitting: Primary Care

## 2023-09-20 ENCOUNTER — Other Ambulatory Visit (INDEPENDENT_AMBULATORY_CARE_PROVIDER_SITE_OTHER): Payer: Self-pay

## 2023-09-20 ENCOUNTER — Other Ambulatory Visit: Payer: Self-pay

## 2023-09-20 MED ORDER — GEMFIBROZIL 600 MG PO TABS
600.0000 mg | ORAL_TABLET | Freq: Two times a day (BID) | ORAL | 1 refills | Status: AC
  Filled 2023-09-20: qty 180, 90d supply, fill #0

## 2023-09-21 ENCOUNTER — Other Ambulatory Visit: Payer: Self-pay

## 2023-12-10 ENCOUNTER — Ambulatory Visit: Payer: Self-pay

## 2023-12-10 NOTE — Telephone Encounter (Signed)
 FYI Only or Action Required?: FYI only for provider.  Patient was last seen in primary care on 09/13/2023 by Celestia Rosaline SQUIBB, NP.  Called Nurse Triage reporting Abdominal Pain.  Symptoms began about a month ago.  Interventions attempted: Nothing.  Symptoms are: gradually worsening.  Triage Disposition: See Physician Within 24 Hours  Patient/caregiver understands and will follow disposition?: Yes    Copied from CRM [id number]. Topic: Clinical - Red Word Triage >> Dec 10, 2023  9:38 AM Pinkey ORN wrote: Red Word that prompted transfer to Nurse Triage: Pain In Belly Button + Left Leg >> Dec 10, 2023  9:39 AM Pinkey ORN wrote: Patient has an upcoming appointment 12/14/23 and is wanting to move it up due to the pain he's experiencing.  Reason for Disposition  [1] MODERATE pain (e.g., interferes with normal activities) AND [2] pain comes and goes (cramps) AND [3] present > 24 hours  (Exception: Pain with Vomiting or Diarrhea - see that Guideline.)  Answer Assessment - Initial Assessment Questions This RN spoke with the patient using assistance from interpreter ID: [phone]. Pt urged to go to UC for symptoms today. Patient states he gets off of work at 2:00 pm and will go to UC then to be seen for symptoms.   1. LOCATION: Where does it hurt?      Belly button area  2. RADIATION: Does the pain shoot anywhere else? (e.g., chest, back)     L Leg to testicle area  3. ONSET: When did the pain begin? (Minutes, hours or days ago)      1 month  4. SUDDEN: Gradual or sudden onset?     Gradual and lasts for about 15 minutes  5. PATTERN Does the pain come and go, or is it constant?     Comes and goes  6. SEVERITY: How bad is the pain?  (e.g., Scale 1-10; mild, moderate, or severe)     8 or 9 out of 10  7. RECURRENT SYMPTOM: Have you ever had this type of stomach pain before? If Yes, ask: When was the last time? and What happened that time?      Denies  8.  RELIEVING/AGGRAVATING FACTORS: What makes it better or worse? (e.g., antacids, bending or twisting motion, bowel movement)     He states nothing makes symptoms better or worst 9. OTHER SYMPTOMS: Do you have any other symptoms? (e.g., back pain, diarrhea, fever, urination pain, vomiting)       Testicle pain  Protocols used: Abdominal Pain - Male-A-AH

## 2023-12-14 ENCOUNTER — Ambulatory Visit (INDEPENDENT_AMBULATORY_CARE_PROVIDER_SITE_OTHER): Payer: Self-pay | Admitting: Primary Care

## 2024-01-11 ENCOUNTER — Ambulatory Visit (INDEPENDENT_AMBULATORY_CARE_PROVIDER_SITE_OTHER): Payer: Self-pay | Admitting: Primary Care

## 2024-02-03 ENCOUNTER — Ambulatory Visit (INDEPENDENT_AMBULATORY_CARE_PROVIDER_SITE_OTHER): Payer: Self-pay | Admitting: Primary Care

## 2024-02-28 ENCOUNTER — Ambulatory Visit: Payer: Self-pay | Admitting: Nurse Practitioner

## 2024-02-28 ENCOUNTER — Encounter: Payer: Self-pay | Admitting: Nurse Practitioner

## 2024-02-28 VITALS — BP 119/68 | HR 69 | Temp 97.7°F | Wt 171.4 lb

## 2024-02-28 DIAGNOSIS — R935 Abnormal findings on diagnostic imaging of other abdominal regions, including retroperitoneum: Secondary | ICD-10-CM

## 2024-02-28 DIAGNOSIS — R1030 Lower abdominal pain, unspecified: Secondary | ICD-10-CM

## 2024-02-28 DIAGNOSIS — M549 Dorsalgia, unspecified: Secondary | ICD-10-CM

## 2024-02-28 LAB — POCT URINALYSIS DIP (CLINITEK)
Bilirubin, UA: NEGATIVE
Blood, UA: NEGATIVE
Glucose, UA: NEGATIVE mg/dL
Ketones, POC UA: NEGATIVE mg/dL
Leukocytes, UA: NEGATIVE
Nitrite, UA: NEGATIVE
POC PROTEIN,UA: NEGATIVE
Spec Grav, UA: <=1.005 — AB
Urobilinogen, UA: 1 U/dL
pH, UA: 6

## 2024-02-28 NOTE — Progress Notes (Signed)
 "  Subjective   Patient ID: Drew Levine, male    DOB: 03-11-1985, 39 y.o.   MRN: [id number]  Chief Complaint  Patient presents with   Abdominal Pain    Below the belly button X awhile. Was going to see Drew Levine for treatment but it is not helping. Feeling very anxious which is causing problems.     Referring provider: Celestia Drew SQUIBB, NP  Drew Levine is a 39 y.o. male with Past Medical History: No date: Hypercholesteremia   HPI  Patient presents today for an acute visit.  This is a patient of Drew Levine.  Patient has been having some pain to suprapubic area for several months.  We will order a CT of abdomen today.  We will place a referral to GI. Denies f/c/s, n/v/d, hemoptysis, PND, leg swelling Denies chest pain or edema    Allergies[1]  Immunization History  Administered Date(s) Administered   Influenza,inj,Quad PF,6+ Mos 12/08/2017   Tdap 08/20/2015    Tobacco History: Tobacco Use History[2] Counseling given: Not Answered   Outpatient Encounter Medications as of 02/28/2024  Medication Sig   cephALEXin  (KEFLEX ) 500 MG capsule Take 1 capsule (500 mg total) by mouth 2 (two) times daily. (Patient not taking: Reported on 02/28/2024)   ergocalciferol  (VITAMIN D2) 1.25 MG (50000 UT) capsule Take 1 capsule (50,000 Units total) by mouth once a week. (Patient not taking: Reported on 02/28/2024)   gemfibrozil  (LOPID ) 600 MG tablet Take 1 tablet (600 mg total) by mouth 2 (two) times daily before a meal. (Patient not taking: Reported on 02/28/2024)   ketoconazole  (NIZORAL ) 2 % cream Apply 1 Application topically daily. (Patient not taking: Reported on 02/28/2024)   omeprazole  (PRILOSEC) 40 MG capsule TOME UNA CAPSULA TODOS LOS DIAS (Patient not taking: Reported on 02/28/2024)   PARoxetine  (PAXIL ) 10 MG tablet Take 1 tablet (10 mg total) by mouth daily. (Patient not taking: Reported on 02/28/2024)   sildenafil  (VIAGRA ) 100 MG tablet Take 1 tablet (100 mg total)  by mouth daily as needed for erectile dysfunction. (Patient not taking: Reported on 02/28/2024)   No facility-administered encounter medications on file as of 02/28/2024.    Review of Systems  Review of Systems  Constitutional: Negative.   HENT: Negative.    Cardiovascular: Negative.   Gastrointestinal:  Positive for abdominal pain.  Allergic/Immunologic: Negative.   Neurological: Negative.   Psychiatric/Behavioral: Negative.       Objective:   BP 119/68   Pulse 69   Temp 97.7 F (36.5 C) (Temporal)   Wt 171 lb 6.4 oz (77.7 kg)   SpO2 98%   BMI 28.52 kg/m   Wt Readings from Last 5 Encounters:  02/28/24 171 lb 6.4 oz (77.7 kg)  09/13/23 164 lb 6.4 oz (74.6 kg)  03/03/23 174 lb (78.9 kg)  01/06/23 175 lb 9.6 oz (79.7 kg)  11/16/22 171 lb 15.3 oz (78 kg)     Physical Exam Vitals and nursing note reviewed.  Constitutional:      General: He is not in acute distress.    Appearance: He is well-developed.  Cardiovascular:     Rate and Rhythm: Normal rate and regular rhythm.  Pulmonary:     Effort: Pulmonary effort is normal.     Breath sounds: Normal breath sounds.  Skin:    General: Skin is warm and dry.  Neurological:     Mental Status: He is alert and oriented to person, place, and time.       Assessment &  Plan:   Back pain, unspecified back location, unspecified back pain laterality, unspecified chronicity -     POCT URINALYSIS DIP (CLINITEK) -     CT ABDOMEN PELVIS W CONTRAST; Future  Lower abdominal pain -     POCT URINALYSIS DIP (CLINITEK) -     Chlamydia/Gonococcus/Trichomonas, NAA -     CT ABDOMEN PELVIS W CONTRAST; Future -     Ambulatory referral to Gastroenterology  Abnormal CT of the abdomen -     Ambulatory referral to Gastroenterology     Return in about 4 weeks (around 03/27/2024).   Drew GORMAN Borer, NP 02/28/2024     [1] No Known Allergies [2]  Social History Tobacco Use  Smoking Status Never  Smokeless Tobacco Never   "

## 2024-03-01 ENCOUNTER — Ambulatory Visit: Payer: Self-pay | Admitting: Nurse Practitioner

## 2024-03-01 LAB — CHLAMYDIA/GONOCOCCUS/TRICHOMONAS, NAA
Chlamydia by NAA: NEGATIVE
Gonococcus by NAA: NEGATIVE
Trich vag by NAA: NEGATIVE

## 2024-03-03 ENCOUNTER — Telehealth (INDEPENDENT_AMBULATORY_CARE_PROVIDER_SITE_OTHER): Payer: Self-pay | Admitting: Primary Care

## 2024-03-03 NOTE — Telephone Encounter (Signed)
 Copied from CRM [id number]. Topic: Referral - Question >> Mar 03, 2024 11:56 AM Delon DASEN wrote: Reason for CRM: Need information for gastro referral- [phone]

## 2024-03-03 NOTE — Telephone Encounter (Signed)
 Called pt.

## 2024-03-03 NOTE — Telephone Encounter (Signed)
 Please reach out to pt and provider GI referral information  [facility] [phone] [address] [place] [address] [place], [place] [address]

## 2024-03-13 ENCOUNTER — Ambulatory Visit (INDEPENDENT_AMBULATORY_CARE_PROVIDER_SITE_OTHER): Payer: Self-pay | Admitting: Primary Care

## 2024-03-27 ENCOUNTER — Ambulatory Visit (INDEPENDENT_AMBULATORY_CARE_PROVIDER_SITE_OTHER): Payer: Self-pay | Admitting: Primary Care

## 2024-03-30 ENCOUNTER — Ambulatory Visit: Payer: Self-pay | Admitting: Nurse Practitioner
